# Patient Record
Sex: Male | Born: 1970 | Hispanic: Yes | Marital: Married | Smoking: Never smoker
Health system: Southern US, Community
[De-identification: ages and names within clinical notes are randomized; demographics above are authoritative.]

## PROBLEM LIST (undated history)

## (undated) HISTORY — PX: OTHER SURGICAL HISTORY: SHX169

---

## 2014-05-19 ENCOUNTER — Emergency Department: Payer: Self-pay | Admitting: Emergency Medicine

## 2018-04-14 ENCOUNTER — Emergency Department: Payer: Self-pay

## 2018-04-14 ENCOUNTER — Inpatient Hospital Stay
Admission: EM | Admit: 2018-04-14 | Discharge: 2018-04-16 | DRG: 872 | Disposition: A | Discharge status: Home / Self Care | Payer: Self-pay | Attending: Internal Medicine | Admitting: Internal Medicine

## 2018-04-14 ENCOUNTER — Encounter: Payer: Self-pay | Admitting: Internal Medicine

## 2018-04-14 ENCOUNTER — Other Ambulatory Visit: Payer: Self-pay

## 2018-04-14 DIAGNOSIS — K122 Cellulitis and abscess of mouth: Secondary | ICD-10-CM | POA: Diagnosis present

## 2018-04-14 DIAGNOSIS — J0430 Supraglottitis, unspecified, without obstruction: Secondary | ICD-10-CM | POA: Diagnosis present

## 2018-04-14 DIAGNOSIS — J9811 Atelectasis: Secondary | ICD-10-CM

## 2018-04-14 DIAGNOSIS — A419 Sepsis, unspecified organism: Principal | ICD-10-CM | POA: Diagnosis present

## 2018-04-14 DIAGNOSIS — K111 Hypertrophy of salivary gland: Secondary | ICD-10-CM | POA: Diagnosis present

## 2018-04-14 DIAGNOSIS — J9601 Acute respiratory failure with hypoxia: Secondary | ICD-10-CM

## 2018-04-14 DIAGNOSIS — E876 Hypokalemia: Secondary | ICD-10-CM | POA: Diagnosis present

## 2018-04-14 DIAGNOSIS — J020 Streptococcal pharyngitis: Secondary | ICD-10-CM | POA: Diagnosis present

## 2018-04-14 DIAGNOSIS — J392 Other diseases of pharynx: Secondary | ICD-10-CM | POA: Diagnosis present

## 2018-04-14 LAB — CBC WITH DIFFERENTIAL/PLATELET
Abs Immature Granulocytes: 0.2 10*3/uL — ABNORMAL HIGH (ref 0.00–0.07)
Basophils Absolute: 0.1 10*3/uL (ref 0.0–0.1)
Basophils Relative: 0 %
EOS ABS: 0.1 10*3/uL (ref 0.0–0.5)
Eosinophils Relative: 0 %
HEMATOCRIT: 41 % (ref 39.0–52.0)
HEMOGLOBIN: 14.1 g/dL (ref 13.0–17.0)
Immature Granulocytes: 1 %
LYMPHS ABS: 0.7 10*3/uL (ref 0.7–4.0)
Lymphocytes Relative: 3 %
MCH: 29.1 pg (ref 26.0–34.0)
MCHC: 34.4 g/dL (ref 30.0–36.0)
MCV: 84.7 fL (ref 80.0–100.0)
MONOS PCT: 5 %
Monocytes Absolute: 1.1 10*3/uL — ABNORMAL HIGH (ref 0.1–1.0)
Neutro Abs: 18.7 10*3/uL — ABNORMAL HIGH (ref 1.7–7.7)
Neutrophils Relative %: 91 %
Platelets: 204 10*3/uL (ref 150–400)
RBC: 4.84 MIL/uL (ref 4.22–5.81)
RDW: 12.3 % (ref 11.5–15.5)
WBC: 20.8 10*3/uL — ABNORMAL HIGH (ref 4.0–10.5)
nRBC: 0 % (ref 0.0–0.2)

## 2018-04-14 LAB — GROUP A STREP BY PCR: Group A Strep by PCR: DETECTED — AB

## 2018-04-14 LAB — COMPREHENSIVE METABOLIC PANEL
ALK PHOS: 60 U/L (ref 38–126)
ALT: 14 U/L (ref 0–44)
AST: 16 U/L (ref 15–41)
Albumin: 3.7 g/dL (ref 3.5–5.0)
Anion gap: 6 (ref 5–15)
BILIRUBIN TOTAL: 1 mg/dL (ref 0.3–1.2)
BUN: 12 mg/dL (ref 6–20)
CO2: 29 mmol/L (ref 22–32)
CREATININE: 1.03 mg/dL (ref 0.61–1.24)
Calcium: 9 mg/dL (ref 8.9–10.3)
Chloride: 99 mmol/L (ref 98–111)
GFR calc Af Amer: >60 mL/min (ref >60)
Glucose, Bld: 151 mg/dL — ABNORMAL HIGH (ref 70–99)
Potassium: 3.3 mmol/L — ABNORMAL LOW (ref 3.5–5.1)
Sodium: 134 mmol/L — ABNORMAL LOW (ref 135–145)
TOTAL PROTEIN: 7.4 g/dL (ref 6.5–8.1)

## 2018-04-14 LAB — HEMOGLOBIN A1C
Hgb A1c MFr Bld: 5.1 % (ref 4.8–5.6)
Mean Plasma Glucose: 99.67 mg/dL

## 2018-04-14 LAB — LACTIC ACID, PLASMA: Lactic Acid, Venous: 1.3 mmol/L (ref 0.5–1.9)

## 2018-04-14 LAB — PROTIME-INR
INR: 1.33
Prothrombin Time: 16.3 seconds — ABNORMAL HIGH (ref 11.4–15.2)

## 2018-04-14 LAB — TSH: TSH: 0.471 u[IU]/mL (ref 0.350–4.500)

## 2018-04-14 LAB — INFLUENZA PANEL BY PCR (TYPE A & B)
INFLBPCR: NEGATIVE
Influenza A By PCR: NEGATIVE

## 2018-04-14 LAB — LIPASE, BLOOD: Lipase: 24 U/L (ref 11–51)

## 2018-04-14 LAB — MRSA PCR SCREENING: MRSA BY PCR: NEGATIVE

## 2018-04-14 LAB — RAPID HIV SCREEN (HIV 1/2 AB+AG)
HIV 1/2 ANTIBODIES: NONREACTIVE
HIV-1 P24 Antigen - HIV24: NONREACTIVE

## 2018-04-14 LAB — PROCALCITONIN: Procalcitonin: 0.67 ng/mL

## 2018-04-14 LAB — TROPONIN I: Troponin I: <0.03 ng/mL (ref <0.03)

## 2018-04-14 MED ORDER — ACETAMINOPHEN 650 MG RE SUPP: 650.0000 mg | Freq: Four times a day (QID) | RECTAL | Status: DC | PRN

## 2018-04-14 MED ORDER — ONDANSETRON HCL 4 MG/2ML IJ SOLN
4.0000 mg | Freq: Once | INTRAMUSCULAR | Status: AC
  Administered 2018-04-14: 4 mg via INTRAVENOUS
  Filled 2018-04-14: qty 2

## 2018-04-14 MED ORDER — OXYMETAZOLINE HCL 0.05 % NA SOLN
1.0000 | Freq: Once | NASAL | Status: AC
  Administered 2018-04-14: 1 via NASAL
  Filled 2018-04-14: qty 15

## 2018-04-14 MED ORDER — SODIUM CHLORIDE 0.9 % IV BOLUS
1000.0000 mL | Freq: Once | INTRAVENOUS | Status: AC
  Administered 2018-04-14: 1000 mL via INTRAVENOUS

## 2018-04-14 MED ORDER — ACETAMINOPHEN 325 MG PO TABS
650.0000 mg | ORAL_TABLET | Freq: Four times a day (QID) | ORAL | Status: DC | PRN
  Administered 2018-04-14: 650 mg via ORAL
  Filled 2018-04-14: qty 2

## 2018-04-14 MED ORDER — LIDOCAINE HCL URETHRAL/MUCOSAL 2 % EX GEL
1.0000 "application " | Freq: Once | CUTANEOUS | Status: AC
  Administered 2018-04-14: 1 via TOPICAL
  Filled 2018-04-14: qty 10

## 2018-04-14 MED ORDER — IOHEXOL 300 MG/ML  SOLN
75.0000 mL | Freq: Once | INTRAMUSCULAR | Status: AC | PRN
  Administered 2018-04-14: 75 mL via INTRAVENOUS

## 2018-04-14 MED ORDER — ONDANSETRON HCL 4 MG PO TABS: 4.0000 mg | ORAL_TABLET | Freq: Four times a day (QID) | ORAL | Status: DC | PRN

## 2018-04-14 MED ORDER — ACETAMINOPHEN 10 MG/ML IV SOLN
1000.0000 mg | Freq: Once | INTRAVENOUS | Status: AC
  Administered 2018-04-14: 1000 mg via INTRAVENOUS
  Filled 2018-04-14: qty 100

## 2018-04-14 MED ORDER — ONDANSETRON HCL 4 MG/2ML IJ SOLN: 4.0000 mg | Freq: Four times a day (QID) | INTRAMUSCULAR | Status: DC | PRN

## 2018-04-14 MED ORDER — SODIUM CHLORIDE 0.9 % IV SOLN
3.0000 g | Freq: Four times a day (QID) | INTRAVENOUS | Status: DC
  Administered 2018-04-14 – 2018-04-16 (×9): 3 g via INTRAVENOUS
  Filled 2018-04-14 (×13): qty 3

## 2018-04-14 MED ORDER — ENOXAPARIN SODIUM 40 MG/0.4ML ~~LOC~~ SOLN
40.0000 mg | SUBCUTANEOUS | Status: DC
  Administered 2018-04-14 – 2018-04-15 (×2): 40 mg via SUBCUTANEOUS
  Filled 2018-04-14 (×2): qty 0.4

## 2018-04-14 MED ORDER — POTASSIUM CHLORIDE IN NACL 40-0.9 MEQ/L-% IV SOLN
INTRAVENOUS | Status: DC
  Administered 2018-04-14 – 2018-04-15 (×3): 125 mL/h via INTRAVENOUS
  Administered 2018-04-15: 50 mL/h via INTRAVENOUS
  Filled 2018-04-14 (×8): qty 1000

## 2018-04-14 MED ORDER — DOCUSATE SODIUM 100 MG PO CAPS
100.0000 mg | ORAL_CAPSULE | Freq: Two times a day (BID) | ORAL | Status: DC
  Administered 2018-04-14 – 2018-04-16 (×4): 100 mg via ORAL
  Filled 2018-04-14 (×4): qty 1

## 2018-04-14 MED ORDER — MORPHINE SULFATE (PF) 4 MG/ML IV SOLN
4.0000 mg | Freq: Once | INTRAVENOUS | Status: AC
  Administered 2018-04-14: 4 mg via INTRAVENOUS
  Filled 2018-04-14: qty 1

## 2018-04-14 MED ORDER — DEXAMETHASONE SODIUM PHOSPHATE 4 MG/ML IJ SOLN
4.0000 mg | Freq: Four times a day (QID) | INTRAMUSCULAR | Status: DC
  Administered 2018-04-14 – 2018-04-16 (×8): 4 mg via INTRAVENOUS
  Filled 2018-04-14 (×10): qty 1

## 2018-04-14 MED ORDER — DEXAMETHASONE SODIUM PHOSPHATE 10 MG/ML IJ SOLN
10.0000 mg | Freq: Once | INTRAMUSCULAR | Status: AC
  Administered 2018-04-14: 10 mg via INTRAVENOUS
  Filled 2018-04-14: qty 1

## 2018-04-14 NOTE — ED Notes (Signed)
Pt continues to clear throat and is now c/o pain in throat with swallowing and without swallowing

## 2018-04-14 NOTE — Progress Notes (Signed)
Admitted this morning for pharyngeal edema, status post laryngoscopy by ENT, patent airway improved left pyriform sinus edema, pleural adenoid edema.  Receiving IV steroids.  Watch in ICU for impending respiratory status.  Continue Unasyn, Decadron, IV fluids, okay to start a diet from ENT standpoint.  Possible repeat endoscopy tomorrow.  Labs, medicines reviewed.  Patient positive for group B strep.

## 2018-04-14 NOTE — Consult Note (Signed)
Jeffrey Gillespie, Jeffrey Gillespie [id number] 1971-05-28 Jeffrey Brittle, MD  Reason for Consult: Neck swelling  HPI: 47 y.o. hispanic male presents to ED with 2 day history of throat swelling and pain and fever.  Reports began yesterday and has progressed with odynophagia and difficulty opening mouth.  Reports several day history of left sided mandibular tooth pain prior to presentation of throat pain and fever.  Denies any breathing difficulty currently.  Allergies: No Known Allergies  ROS: Review of systems normal other than 12 systems except per HPI.  PMH: No past medical history on file.  FH: No family history on file.  SH:  Social History   Socioeconomic History  . Marital status: Married    Spouse name: Not on file  . Number of children: Not on file  . Years of education: Not on file  . Highest education level: Not on file  Occupational History  . Not on file  Social Needs  . Financial resource strain: Not on file  . Food insecurity:    Worry: Not on file    Inability: Not on file  . Transportation needs:    Medical: Not on file    Non-medical: Not on file  Tobacco Use  . Smoking status: Not on file  Substance and Sexual Activity  . Alcohol use: Not on file  . Drug use: Not on file  . Sexual activity: Not on file  Lifestyle  . Physical activity:    Days per week: Not on file    Minutes per session: Not on file  . Stress: Not on file  Relationships  . Social connections:    Talks on phone: Not on file    Gets together: Not on file    Attends religious service: Not on file    Active member of club or organization: Not on file    Attends meetings of clubs or organizations: Not on file    Relationship status: Not on file  . Intimate partner violence:    Fear of current or ex partner: Not on file    Emotionally abused: Not on file    Physically abused: Not on file    Forced sexual activity: Not on file  Other Topics Concern  . Not on file  Social History Narrative  .  Not on file    PSH: denies immunization for mumps  Physical  Exam:  GEN-  CN 2-12 grossly intact and symmetric. EARS-  External ears clear NOSE-  Clear anteriorly OC/OP-  Trismus to 2cm, poor dentition, tenderness and mild erythema surrounding fractured molar on left.  Soft floor of mouth with no induration NECK-  Significant bilateral tender level 1 and level 2 edema and submandibular glands, no fluctuance felt Resp- unlabored CARD-  RRR  Procedure:  Flexible laryngoscopy-  After verbal consent obtained, the patient's nasal cavity was anesthetized with Afrin and Lidocaine.  Trans-nasal flexible laryngoscopy performed.  This demonstrated bilateral tonsillar edema and inflammation L >R, and lateral pharyngeal wall swelling on left and involvement of left piriform sinus and left arytenoid with boggy edema.  The arytenoid is prolapsing into the patient's airway.  Vocal folds easily visualized and patent.   CT-  Fat stranding submental and floor of mouth and bilateral submandibular gland swelling, left supraglottic and parapharyngeal swelling on left  A/P: Pharyngitis/Supraglottitis/Cellulitis  Plan:  Unclear where source of infection is from, but favor dental despite no findings on CT scan.  Given findings recommend admission and will plan on repeat Laryngoscopy around  noon to see how airway is progressing.  Agree with mumps workup given CT findings but suspect dental source with spread.  Recommend IV Abx as well as IV Decadron.  Will recheck around noon.   Jeffrey Gillespie 04/14/2018 6:56 AM

## 2018-04-14 NOTE — Consult Note (Signed)
   Name: Jeffrey Gillespie MRN: [id number] DOB: 08/04/1970     CONSULTATION DATE: 04/14/2018   HISTORY OF PRESENT ILLNESS:    47 years old man with no past medical history.  Patient presented to the ED with lower jaw and neck pain.  Pain was associated with fever and upper neck swelling.  While in the emergency room the patient was evaluated by ENT and had endoscopy done and was diagnosed with posterior pharyngeal swelling.  He was started on antibiotics plus Decadron and admission to critical care was requested for airway monitoring. Patient arrived to ICU awake in no distress and tolerating room air.  All history was obtained the form the EMR and the patient. PAST MEDICAL HISTORY :   has no past medical history on file.  has a past surgical history that includes none. Prior to Admission medications   Not on File   No Known Allergies  FAMILY HISTORY:  family history is not on file. SOCIAL HISTORY:  reports that he has never smoked. He has never used smokeless tobacco.  REVIEW OF SYSTEMS:   Unable to obtain due to critical illness   VITAL SIGNS: Temp:  [98.2 F (36.8 C)-99.9 F (37.7 C)] 98.2 F (36.8 C) (11/02 1500) Pulse Rate:  [76-127] 83 (11/02 1500) Resp:  [17-25] 25 (11/02 1500) BP: (103-132)/(67-90) 111/73 (11/02 1500) SpO2:  [91 %-97 %] 96 % (11/02 1500) Weight:  [83.3 kg-86.2 kg] 83.3 kg (11/02 1030)  Physical Examination:  Awake and oriented with no acute focal neurological deficits Tolerating room air, no distress, able to talk in full sentences, bilateral equal air entry with no adventitious sounds S1 & S2 are audible with no murmur Benign abdomen with normal peristalsis No leg edema  ASSESSMENT / PLAN:  Impending acute respiratory failure was airway compromise due to pharyngeal edema.  Currently patient is tolerating room air no distress and able to talk in full sentences. -Monitor airway and consider intubation for airway protection if no  improvement  Subglottic edema with dysphagia was acute bilateral submandibular sialadenitis left more than right, status post transnasal flexible laryngoscopy.  Improving as reported by ENT.  Group A strep culture -Unasyn + Decadron -Management as per ENT  Hypokalemia -Replete and monitor electrolytes  Supportive care  Critical care time 35 minutes

## 2018-04-14 NOTE — Op Note (Signed)
....  04/14/2018  11:51 AM    [name]  [id number]   Pre-Op Dx:  Supraglottic edema, dysphagia  Post-op Dx: same  Proc: Trans-nasal flexible laryngoscopy  Surg:  Kerry Dory:  Topical 4% Lidocaine with Phenylephrine  EBL:  none  Comp:  none  Findings:  Improved left arytenoid edema with resolution of bogginess and prolapse into airway, continued but improved left piriform sinus edema and erythema.  Widely patent airway.  Procedure: After the patient was identified in ICU bed and verbal consent obtained, the patient was anesthetized topically with Lidocaine/Phenylephrine mixture.  At this time, the flexible fiberoptic scope was placed within the patient's right nasal cavity.  Visualization of the patient's nasal cavity, nasopharynx, pharynx, and larynx and hypopharynx was made.  Findings are listed above.  The patient tolerated the procedure well.  Dispo:   To ICU stable condition  Plan:  OK for diet from ENT perspective.  Will consider repeat laryngoscopy tomorrow.  Edmar Blankenburg  04/14/2018 11:51 AM

## 2018-04-14 NOTE — ED Notes (Signed)
Pt placed on droplet precautions until cleared from dx of mumps after discussion with charge RN Gearldine Bienenstock

## 2018-04-14 NOTE — ED Provider Notes (Signed)
[facility] Emergency Department Provider Note  ____________________________________________   First MD Initiated Contact with Patient 04/14/18 [phone]     (approximate)  I have reviewed the triage vital signs and the nursing notes.   HISTORY  Chief Complaint Dental Pain    HPI Jeffrey Gillespie is a 47 y.o. male who comes to the emergency department with roughly 1 month of poor dentition and dental pain although his symptoms have been acutely worse for the past 2 days.  This evening his symptoms became more severe and it was difficult for him to swallow.  He does feel like he has had a fever and chills although has not measured it.  No cough.  He has not seen a dentist in "years".  He denies past medical history and takes no medications.  Nothing seems to make his symptoms better or worse.  He was able to eat and drink without difficulty this morning.  The pain is primarily posteriorly on his left upper and lower teeth.  Symptoms are currently moderate in severity.  His pain is in his left lower and left upper tooth sharp aching and nonradiating.     No past medical history on file.  There are no active problems to display for this patient.     Prior to Admission medications   Not on File    Allergies Patient has no known allergies.  No family history on file.  Social History Social History   Tobacco Use  . Smoking status: Not on file  Substance Use Topics  . Alcohol use: Not on file  . Drug use: Not on file    Review of Systems Constitutional: Positive for fevers Eyes: No visual changes. ENT: Positive for dental pain Cardiovascular: Denies chest pain. Respiratory: Denies shortness of breath. Gastrointestinal: No abdominal pain.  No nausea, no vomiting.  No diarrhea.  No constipation. Genitourinary: Negative for dysuria. Musculoskeletal: Negative for back pain. Skin: Negative for rash. Neurological: Negative for headaches, focal weakness  or numbness.   ____________________________________________   PHYSICAL EXAM:  VITAL SIGNS: ED Triage Vitals [04/14/18 0351]  Enc Vitals Group     BP      Pulse      Resp      Temp      Temp src      SpO2      Weight 190 lb (86.2 kg)     Height 5\' 11"  (1.803 m)     Head Circumference      Peak Flow      Pain Score 9     Pain Loc      Pain Edu?      Excl. in GC?     Constitutional: Alert and oriented x4 speaks in full clear sentences with no change in his voice Eyes: PERRL EOMI. Head: Atraumatic. Nose: No congestion/rhinnorhea. Mouth/Throat: Positive for trismus.  He has no sublingual induration although he does have submandibular induration and fullness.  Uvula is midline.  He has left posterior pharyngeal erythema but no exudate Neck: No stridor.   Cardiovascular: Tachycardic rate, regular rhythm. Grossly normal heart sounds.  Good peripheral circulation. Respiratory: Normal respiratory effort.  No retractions. Lungs CTAB and moving good air Gastrointestinal: Soft nontender Musculoskeletal: No lower extremity edema   Neurologic:  Normal speech and language. No gross focal neurologic deficits are appreciated. Skin:  Skin is warm, dry and intact. No rash noted. Psychiatric: Mood and affect are normal. Speech and behavior are normal.  ____________________________________________   DIFFERENTIAL includes but not limited to  Ludwig's angina, mumps, dental infection, dental abscess ____________________________________________   LABS (all labs ordered are listed, but only abnormal results are displayed)  Labs Reviewed  GROUP A STREP BY PCR - Abnormal; Notable for the following components:      Result Value   Group A Strep by PCR DETECTED (*)    All other components within normal limits  COMPREHENSIVE METABOLIC PANEL - Abnormal; Notable for the following components:   Sodium 134 (*)    Potassium 3.3 (*)    Glucose, Bld 151 (*)    All other components within  normal limits  CBC WITH DIFFERENTIAL/PLATELET - Abnormal; Notable for the following components:   WBC 20.8 (*)    Neutro Abs 18.7 (*)    Monocytes Absolute 1.1 (*)    Abs Immature Granulocytes 0.20 (*)    All other components within normal limits  PROTIME-INR - Abnormal; Notable for the following components:   Prothrombin Time 16.3 (*)    All other components within normal limits  CULTURE, BLOOD (ROUTINE X 2)  CULTURE, BLOOD (ROUTINE X 2)  RESPIRATORY PANEL BY PCR  LIPASE, BLOOD  TROPONIN I  PROCALCITONIN  LACTIC ACID, PLASMA  INFLUENZA PANEL BY PCR (TYPE A & B)  RAPID HIV SCREEN (HIV 1/2 AB+AG)  URINALYSIS, ROUTINE W REFLEX MICROSCOPIC  MUMPS ANTIBODY, IGG  MUMPS ANTIBODY, IGM  MISC [facility] TEST (SEND OUT)    Lab work reviewed by me shows white count of 21 concerning for acute infection.  He is group A strep positive as well.  Increased sugar could be secondary to stress or undiagnosed diabetes mellitus. __________________________________________  EKG  ED ECG REPORT I, [name], the attending physician, personally viewed and interpreted this ECG.  Date: 04/14/2018 EKG Time:  Rate: 110 Rhythm: Sinus tachycardia QRS Axis: normal Intervals: normal ST/T Wave abnormalities: normal Narrative Interpretation: no evidence of acute ischemia  ____________________________________________  RADIOLOGY  CT of the neck reviewed by me shows submandibular swelling with a patent airway.  No abscess identified Chest x-ray reviewed by me with no acute disease ____________________________________________   PROCEDURES  Procedure(s) performed: Yes  .Critical Care Performed by: [name], MD Authorized by: [name], MD   Critical care provider statement:    Critical care time (minutes):  35   Critical care time was exclusive of:  Separately billable procedures and treating other patients   Critical care was necessary to treat or prevent imminent or  life-threatening deterioration of the following conditions:  Respiratory failure   Critical care was time spent personally by me on the following activities:  Development of treatment plan with patient or surrogate, discussions with consultants, evaluation of patient's response to treatment, examination of patient, obtaining history from patient or surrogate, ordering and performing treatments and interventions, ordering and review of laboratory studies, ordering and review of radiographic studies, pulse oximetry, re-evaluation of patient's condition and review of old charts    Critical Care performed: Yes  ____________________________________________   INITIAL IMPRESSION / ASSESSMENT AND PLAN / ED COURSE  Pertinent labs & imaging results that were available during my care of the patient were reviewed by me and considered in my medical decision making (see chart for details).   As part of my medical decision making, I reviewed the following data within the electronic MEDICAL RECORD NUMBER History obtained from family if available, nursing notes, old chart and ekg, as well as notes from prior ED visits.  The  patient comes to the emergency department with a temperature of 99.9 degrees orally and likely has a true fever should recheck her core temperature.  He has trismus concerning for posterior infection and is tachycardic.  He has no history of diabetes but does not see doctors frequently.  I have a high clinical suspicion for Ludwig's angina so cultures and broad labs are ordered in addition to IV Unasyn every 6 hours.  CT soft tissue the neck is pending at this point.  He is protecting his airway.     ----------------------------------------- 6:56 AM on 04/14/2018 -----------------------------------------  Dr. Andee Poles came to bedside and scoped the patient and agrees with inpatient admission for IV unasyn, recommends IV decadron and he'll rescope the patient around noon to evaluate for his airway  patency.  I spoke with Dr. Sheryle Hail of the hospitalist service who has graciously agreed to admit the patient to his service.  The patient's been updated and agrees with the plan.  His pain is controlled after 4 mg of IV morphine and 4 mg of IV Zofran. ____________________________________________   FINAL CLINICAL IMPRESSION(S) / ED DIAGNOSES  Final diagnoses:  Ludwig's angina  Sepsis, due to unspecified organism, unspecified whether acute organ dysfunction present [facility])      NEW MEDICATIONS STARTED DURING THIS VISIT:  New Prescriptions   No medications on file     Note:  This document was prepared using Dragon voice recognition software and may include unintentional dictation errors.     [name], MD 04/14/18 [phone]

## 2018-04-14 NOTE — Progress Notes (Signed)
CODE SEPSIS - PHARMACY COMMUNICATION  **Broad Spectrum Antibiotics should be administered within 1 hour of Sepsis diagnosis**  Time Code Sepsis Called/Page Received: 11/02 0415  Antibiotics Ordered: 1102 0416  Time of 1st antibiotic administration: 1102 0437  Additional action taken by pharmacy:   If necessary, Name of Provider/Nurse Contacted:    Erich Montane ,PharmD Clinical Pharmacist  04/14/2018  4:51 AM

## 2018-04-14 NOTE — ED Notes (Signed)
Pt given small amount of water - ok's by Dr Shaune Pollack

## 2018-04-14 NOTE — ED Notes (Signed)
Called to give report on pt and was advised that the room had a pt in it and they would return the call when the bed was ready

## 2018-04-14 NOTE — ED Triage Notes (Signed)
Pt ambulatory to triage with no difficulty. Pt reports started 2 days ago with dental pain to teeth on the left side upper and lower. Tonight developed pain in his throat ans swelling. Pt talking in full and complete sentences with no difficulty at this time.

## 2018-04-14 NOTE — H&P (Signed)
Jeffrey Gillespie is an 47 y.o. male.   Chief Complaint: Dental pain HPI: The patient with no significant past medical history presents emergency department complaining of jaw and neck pain.  The patient reports that his symptoms began with dental pain in the lower left molars approximately 1 month ago.  The pain gradually migrated up to his jaw and then into his upper posterior jawline on the left.  He admits to some intermittent headache in this last month but reports that he began to have fever this morning and swelling of his neck and jaw yesterday.  In the emergency department today it is difficult for the patient to open his mouth.  Endoscopy was performed in the emergency department and otolaryngology felt that there may be some posterior pharyngeal swelling.  They recommended antibiotics as well as Decadron which was initiated prior to the emergency department staff calling the hospitalist service for admission  History reviewed. No pertinent past medical history.  No chronic medical problems  Past Surgical History:  Procedure Laterality Date  . none      Family History  Problem Relation Age of Onset  . Diabetes Mellitus II Neg Hx   . CAD Neg Hx    Social History:  has no tobacco, alcohol, and drug history on file.  Allergies: No Known Allergies  Prior to Admission medications   Not on File     Results for orders placed or performed during the hospital encounter of 04/14/18 (from the past 48 hour(s))  Comprehensive metabolic panel     Status: Abnormal   Collection Time: 04/14/18  4:24 AM  Result Value Ref Range   Sodium 134 (L) 135 - 145 mmol/L   Potassium 3.3 (L) 3.5 - 5.1 mmol/L   Chloride 99 98 - 111 mmol/L   CO2 29 22 - 32 mmol/L   Glucose, Bld 151 (H) 70 - 99 mg/dL   BUN 12 6 - 20 mg/dL   Creatinine, Ser 1.03 0.61 - 1.24 mg/dL   Calcium 9.0 8.9 - 10.3 mg/dL   Total Protein 7.4 6.5 - 8.1 g/dL   Albumin 3.7 3.5 - 5.0 g/dL   AST 16 15 - 41 U/L   ALT 14 0 - 44 U/L   Alkaline Phosphatase 60 38 - 126 U/L   Total Bilirubin 1.0 0.3 - 1.2 mg/dL   GFR calc non Af Amer >60 >60 mL/min   GFR calc Af Amer >60 >60 mL/min    Comment: (NOTE) The eGFR has been calculated using the CKD EPI equation. This calculation has not been validated in all clinical situations. eGFR's persistently <60 mL/min signify possible Chronic Kidney Disease.    Anion gap 6 5 - 15    Comment: Performed at [facility], [address] [place], [place] [address]  Lipase, blood     Status: None   Collection Time: 04/14/18  4:24 AM  Result Value Ref Range   Lipase 24 11 - 51 U/L    Comment: Performed at [facility], [address] [place], [place] [address]  Troponin I     Status: None   Collection Time: 04/14/18  4:24 AM  Result Value Ref Range   Troponin I <0.03 <0.03 ng/mL    Comment: Performed at [facility], [address] [place], [place] [address]  CBC WITH DIFFERENTIAL     Status: Abnormal   Collection Time: 04/14/18  4:24 AM  Result Value Ref Range   WBC 20.8 (H) 4.0 -  10.5 K/uL   RBC 4.84 4.22 - 5.81 MIL/uL   Hemoglobin 14.1 13.0 - 17.0 g/dL   HCT 41.0 39.0 - 52.0 %   MCV 84.7 80.0 - 100.0 fL   MCH 29.1 26.0 - 34.0 pg   MCHC 34.4 30.0 - 36.0 g/dL   RDW 12.3 11.5 - 15.5 %   Platelets 204 150 - 400 K/uL   nRBC 0.0 0.0 - 0.2 %   Neutrophils Relative % 91 %   Neutro Abs 18.7 (H) 1.7 - 7.7 K/uL   Lymphocytes Relative 3 %   Lymphs Abs 0.7 0.7 - 4.0 K/uL   Monocytes Relative 5 %   Monocytes Absolute 1.1 (H) 0.1 - 1.0 K/uL   Eosinophils Relative 0 %   Eosinophils Absolute 0.1 0.0 - 0.5 K/uL   Basophils Relative 0 %   Basophils Absolute 0.1 0.0 - 0.1 K/uL   RBC Morphology MORPHOLOGY UNREMARKABLE    Immature Granulocytes 1 %   Abs Immature Granulocytes 0.20 (H) 0.00 - 0.07 K/uL    Comment: Performed at [facility], [address] [place], [place] [address]  Procalcitonin     Status: None   Collection Time: 04/14/18  4:24 AM   Result Value Ref Range   Procalcitonin 0.67 ng/mL    Comment:        Interpretation: PCT > 0.5 ng/mL and <= 2 ng/mL: Systemic infection (sepsis) is possible, but other conditions are known to elevate PCT as well. (NOTE)       Sepsis PCT Algorithm           Lower Respiratory Tract                                      Infection PCT Algorithm    ----------------------------     ----------------------------         PCT < 0.25 ng/mL                PCT < 0.10 ng/mL         Strongly encourage             Strongly discourage   discontinuation of antibiotics    initiation of antibiotics    ----------------------------     -----------------------------       PCT 0.25 - 0.50 ng/mL            PCT 0.10 - 0.25 ng/mL               OR       >80% decrease in PCT            Discourage initiation of                                            antibiotics      Encourage discontinuation           of antibiotics    ----------------------------     -----------------------------         PCT >= 0.50 ng/mL              PCT 0.26 - 0.50 ng/mL                AND       <80% decrease in PCT  Encourage initiation of                                             antibiotics       Encourage continuation           of antibiotics    ----------------------------     -----------------------------        PCT >= 0.50 ng/mL                  PCT > 0.50 ng/mL               AND         increase in PCT                  Strongly encourage                                      initiation of antibiotics    Strongly encourage escalation           of antibiotics                                     -----------------------------                                           PCT <= 0.25 ng/mL                                                 OR                                        > 80% decrease in PCT                                     Discontinue / Do not initiate                                              antibiotics Performed at [facility], [address] [place], [place] [address]   Protime-INR     Status: Abnormal   Collection Time: 04/14/18  4:24 AM  Result Value Ref Range   Prothrombin Time 16.3 (H) 11.4 - 15.2 seconds   INR 1.33     Comment: Performed at [facility], [address], [place], [place] [address]  Blood Culture (routine x 2)     Status: None (Preliminary result)   Collection Time: 04/14/18  4:24 AM  Result Value Ref Range   Specimen Description BLOOD RIGHT FOREARM    Special Requests      BOTTLES DRAWN AEROBIC AND ANAEROBIC Blood Culture adequate volume   Culture  NO GROWTH < 12 HOURS Performed at [facility], [address] [place], [place] [address]    Report Status PENDING   Blood Culture (routine x 2)     Status: None (Preliminary result)   Collection Time: 04/14/18  4:24 AM  Result Value Ref Range   Specimen Description BLOOD RIGHT ANTECUBITAL    Special Requests      BOTTLES DRAWN AEROBIC AND ANAEROBIC Blood Culture adequate volume   Culture      NO GROWTH < 12 HOURS Performed at [facility], [address] [place], [place] [address]    Report Status PENDING   Lactic acid, plasma     Status: None   Collection Time: 04/14/18  4:24 AM  Result Value Ref Range   Lactic Acid, Venous 1.3 0.5 - 1.9 mmol/L    Comment: Performed at [facility], [place], [place] [address]  Rapid HIV screen (HIV 1/2 Ab+Ag)     Status: None   Collection Time: 04/14/18  4:24 AM  Result Value Ref Range   HIV-1 P24 Antigen - HIV24 NON REACTIVE NON REACTIVE   HIV 1/2 Antibodies NON REACTIVE NON REACTIVE   Interpretation (HIV Ag Ab)      A non reactive test result means that HIV 1 or HIV 2 antibodies and HIV 1 p24 antigen were not detected in the specimen.    Comment: Performed at [facility], [address] [place], [place] [address]  Influenza panel by PCR (type A & B)     Status: None    Collection Time: 04/14/18  6:16 AM  Result Value Ref Range   Influenza A By PCR NEGATIVE NEGATIVE   Influenza B By PCR NEGATIVE NEGATIVE    Comment: (NOTE) The Xpert Xpress Flu assay is intended as an aid in the diagnosis of  influenza and should not be used as a sole basis for treatment.  This  assay is FDA approved for nasopharyngeal swab specimens only. Nasal  washings and aspirates are unacceptable for Xpert Xpress Flu testing. Performed at [facility], [place], [place] [address]   Group A Strep by PCR [facility] Only)     Status: Abnormal   Collection Time: 04/14/18  6:16 AM  Result Value Ref Range   Group A Strep by PCR DETECTED (A) NOT DETECTED    Comment: Performed at [facility], [address] [place], [place] [address]   Ct Soft Tissue Neck W Contrast  Result Date: 04/14/2018 CLINICAL DATA:  LEFT dental pain for 2 days, now with throat pain and swelling. EXAM: CT NECK WITH CONTRAST TECHNIQUE: Multidetector CT imaging of the neck was performed using the standard protocol following the bolus administration of intravenous contrast. CONTRAST:  60m OMNIPAQUE IOHEXOL 300 MG/ML  SOLN COMPARISON:  None. FINDINGS: PHARYNX AND LARYNX: Mild LEFT hypopharyngeal edema narrowing the LEFT piriform sinus. No extension into floor mouth. SALIVARY GLANDS: Mildly enlarged edematous LEFT greater than RIGHT submandibular glands without sialolith, ductal dilatation or mass. Normal parotid glands. THYROID: Normal. LYMPH NODES: Mild reactive lymphadenopathy with reniform morphology, homogeneous enhancement. VASCULAR: Normal. LIMITED INTRACRANIAL: Normal. VISUALIZED ORBITS: Normal. MASTOIDS AND VISUALIZED PARANASAL SINUSES: Mildly atretic RIGHT maxillary sinus with findings of chronic sinusitis. LEFT maxillary mucosal retention cyst. Minimal LEFT mastoid effusion. SKELETON: Nonacute. No CT findings of dental pathology. Moderate C6-7 spondylosis. Moderate RIGHT  temporomandibular osteoarthrosis. UPPER CHEST: Lung apices are clear. No superior mediastinal lymphadenopathy. OTHER: Anterior neck subcutaneous fat  stranding. Punctate LEFT anterior neck calcification versus radiopaque foreign bodies. IMPRESSION: 1. Acute LEFT greater than RIGHT submandibular sialoadenitis. 2. Transspatial edema sparing floor of mouth. No abscess. Patent airway. 3. Mild reactive lymphadenopathy. 4. Punctate LEFT anterior neck radiopaque foreign body versus calcification, potentially related. Electronically Signed   By: Elon Alas M.D.   On: 04/14/2018 05:37   Dg Chest Port 1 View  Result Date: 04/14/2018 CLINICAL DATA:  47 year old male with sepsis. EXAM: PORTABLE CHEST 1 VIEW COMPARISON:  None. FINDINGS: The heart size and mediastinal contours are within normal limits. Both lungs are clear. The visualized skeletal structures are unremarkable. IMPRESSION: No active disease. Electronically Signed   By: Anner Crete M.D.   On: 04/14/2018 05:09    Review of Systems  Constitutional: Positive for fever. Negative for chills.  HENT: Positive for sore throat. Negative for tinnitus.   Eyes: Negative for blurred vision and redness.  Respiratory: Negative for cough and shortness of breath.   Cardiovascular: Negative for chest pain, palpitations, orthopnea and PND.  Gastrointestinal: Negative for abdominal pain, diarrhea, nausea and vomiting.  Genitourinary: Negative for dysuria, frequency and urgency.  Musculoskeletal: Negative for joint pain and myalgias.  Skin: Negative for rash.       No lesions  Neurological: Negative for speech change, focal weakness and weakness.  Endo/Heme/Allergies: Does not bruise/bleed easily.       No temperature intolerance  Psychiatric/Behavioral: Negative for depression and suicidal ideas.    Blood pressure 103/67, pulse (!) 114, temperature 99.9 F (37.7 C), temperature source Oral, resp. rate (!) 24, height 5' 11"  (1.803 m), weight 86.2 kg,  SpO2 91 %. Physical Exam  Constitutional: He is oriented to person, place, and time. He appears well-developed and well-nourished. No distress.  HENT:  Head: Normocephalic and atraumatic.  Mouth/Throat: Oropharynx is clear and moist.  Eyes: Pupils are equal, round, and reactive to light. Conjunctivae and EOM are normal. No scleral icterus.  Neck: Normal range of motion. Neck supple. No JVD present. No tracheal deviation present. No thyromegaly present.    trismus  Cardiovascular: Normal rate, regular rhythm and normal heart sounds. Exam reveals no gallop and no friction rub.  No murmur heard. Respiratory: Effort normal and breath sounds normal. No respiratory distress. He has no wheezes.  GI: Soft. Bowel sounds are normal. He exhibits no distension. There is no tenderness.  Genitourinary:  Genitourinary Comments: Deferred  Musculoskeletal: Normal range of motion. He exhibits no edema.  Lymphadenopathy:    He has no cervical adenopathy.  Neurological: He is alert and oriented to person, place, and time. No cranial nerve deficit.  Skin: Skin is warm and dry. No rash noted. No erythema.  Psychiatric: He has a normal mood and affect. His behavior is normal. Judgment and thought content normal.     Assessment/Plan This is a 47 year old male admitted for pharyngeal edema. 1.  Pharyngeal edema: Differential diagnosis includes mumps versus Ludwig's angina.  Significant submandibular swelling.  The patient reports that he is fully vaccinated.  Continue Unasyn as well as Decadron.  ENT to reevaluate in a few hours. 2.  Hypokalemia: Replete potassium 3.  DVT prophylaxis: Lovenox 4.  GI prophylaxis: None The patient is a full code.  Time spent on admission orders and patient care approximately 45 minutes  Harrie Foreman, MD 04/14/2018, 8:04 AM

## 2018-04-14 NOTE — ED Notes (Signed)
Pt has swelling to bilat side of neck - he denies shortness of breath or difficulty breathing at this time - he reports that he is having pain with swallowing and request water to rinse his mouth - at this time pt respirations are even and unlabored and NAD

## 2018-04-15 ENCOUNTER — Inpatient Hospital Stay: Payer: Self-pay

## 2018-04-15 LAB — URINALYSIS, ROUTINE W REFLEX MICROSCOPIC
Bilirubin Urine: NEGATIVE
Glucose, UA: NEGATIVE mg/dL
Hgb urine dipstick: NEGATIVE
Ketones, ur: 5 mg/dL — AB
Leukocytes, UA: NEGATIVE
Nitrite: NEGATIVE
Protein, ur: NEGATIVE mg/dL
Specific Gravity, Urine: 1.019 (ref 1.005–1.030)
pH: 7 (ref 5.0–8.0)

## 2018-04-15 LAB — RESPIRATORY PANEL BY PCR
Adenovirus: NOT DETECTED
BORDETELLA PERTUSSIS-RVPCR: NOT DETECTED
CORONAVIRUS HKU1-RVPPCR: NOT DETECTED
CORONAVIRUS NL63-RVPPCR: NOT DETECTED
Chlamydophila pneumoniae: NOT DETECTED
Coronavirus 229E: NOT DETECTED
Coronavirus OC43: NOT DETECTED
Influenza A: NOT DETECTED
Influenza B: NOT DETECTED
METAPNEUMOVIRUS-RVPPCR: NOT DETECTED
Mycoplasma pneumoniae: NOT DETECTED
PARAINFLUENZA VIRUS 2-RVPPCR: NOT DETECTED
PARAINFLUENZA VIRUS 3-RVPPCR: NOT DETECTED
Parainfluenza Virus 1: NOT DETECTED
Parainfluenza Virus 4: NOT DETECTED
RHINOVIRUS / ENTEROVIRUS - RVPPCR: NOT DETECTED
Respiratory Syncytial Virus: NOT DETECTED

## 2018-04-15 LAB — BASIC METABOLIC PANEL
Anion gap: 8 (ref 5–15)
BUN: 13 mg/dL (ref 6–20)
CO2: 22 mmol/L (ref 22–32)
Calcium: 8.8 mg/dL — ABNORMAL LOW (ref 8.9–10.3)
Chloride: 109 mmol/L (ref 98–111)
Creatinine, Ser: 0.73 mg/dL (ref 0.61–1.24)
GFR calc Af Amer: >60 mL/min (ref >60)
GLUCOSE: 140 mg/dL — AB (ref 70–99)
POTASSIUM: 4 mmol/L (ref 3.5–5.1)
Sodium: 139 mmol/L (ref 135–145)

## 2018-04-15 LAB — PHOSPHORUS: Phosphorus: 2.3 mg/dL — ABNORMAL LOW (ref 2.5–4.6)

## 2018-04-15 LAB — MAGNESIUM: Magnesium: 2 mg/dL (ref 1.7–2.4)

## 2018-04-15 MED ORDER — BOOST / RESOURCE BREEZE PO LIQD CUSTOM
1.0000 | Freq: Three times a day (TID) | ORAL | Status: DC
  Administered 2018-04-15 (×2): 1 via ORAL

## 2018-04-15 MED ORDER — ADULT MULTIVITAMIN W/MINERALS CH
1.0000 | ORAL_TABLET | Freq: Every day | ORAL | Status: DC
  Administered 2018-04-16: 1 via ORAL
  Filled 2018-04-15: qty 1

## 2018-04-15 NOTE — Progress Notes (Addendum)
[facility]   PATIENT NAME: Jeffrey Gillespie    MR#:  [id number]  DATE OF BIRTH:  [birth year]  SUBJECTIVE: Patient admitted for pharyngitis, angioedema, strep A pharyngitis.  Receiving IV antibiotics, IV Decadron, he states that his swelling decreased, his pain also decreased in pharyngeal area and able to open mouth fully.  On room air right now.  CHIEF COMPLAINT:   Chief Complaint  Patient presents with  . Dental Pain    REVIEW OF SYSTEMS:   ROS CONSTITUTIONAL: No fever, fatigue or weakness.  EYES: No blurred or double vision.  EARS, NOSE, AND THROAT: No tinnitus or ear pain.  Patient has s edema in submandibular area, slightly hard to palpation but no tenderness. RESPIRATORY: No cough, shortness of breath, wheezing or hemoptysis.  CARDIOVASCULAR: No chest pain, orthopnea, edema.  GASTROINTESTINAL: No nausea, vomiting, diarrhea or abdominal pain.  GENITOURINARY: No dysuria, hematuria.  ENDOCRINE: No polyuria, nocturia,  HEMATOLOGY: No anemia, easy bruising or bleeding SKIN: No rash or lesion. MUSCULOSKELETAL: No joint pain or arthritis.   NEUROLOGIC: No tingling, numbness, weakness.  PSYCHIATRY: No anxiety or depression.   DRUG ALLERGIES:  No Known Allergies  VITALS:  Blood pressure 136/81, pulse (!) 106, temperature 98.6 F (37 C), temperature source Oral, resp. rate 18, height 5\' 11"  (1.803 m), weight 83.3 kg, SpO2 97 %.  PHYSICAL EXAMINATION:  GENERAL:  47 y.o.-year-old patient lying in the bed with no acute distress.  EYES: Pupils equal, round, reactive to light and accommodation. No scleral icterus. Extraocular muscles intact.  HEENT: Head atraumatic, normocephalic improved trismus, improved floor of the mouth edema, tenderness.  Patient has bilateral submandibular gland hypertrophy, submental lymphadenopathy.. .  NECK:  Supple, no jugular venous distention. No thyroid enlargement, no tenderness.  LUNGS: Normal breath  sounds bilaterally, no wheezing, rales,rhonchi or crepitation. No use of accessory muscles of respiration.  CARDIOVASCULAR: S1, S2 normal. No murmurs, rubs, or gallops.  ABDOMEN: Soft, nontender, nondistended. Bowel sounds present. No organomegaly or mass.  EXTREMITIES: No pedal edema, cyanosis, or clubbing.  NEUROLOGIC: Cranial nerves II through XII are intact. Muscle strength 5/5 in all extremities. Sensation intact. Gait not checked.  PSYCHIATRIC: The patient is alert and oriented x 3.  SKIN: No obvious rash, lesion, or ulcer.    LABORATORY PANEL:   CBC Recent Labs  Lab 04/14/18 0424  WBC 20.8*  HGB 14.1  HCT 41.0  PLT 204   ------------------------------------------------------------------------------------------------------------------  Chemistries  Recent Labs  Lab 04/14/18 0424 04/15/18 0418  NA 134* 139  K 3.3* 4.0  CL 99 109  CO2 29 22  GLUCOSE 151* 140*  BUN 12 13  CREATININE 1.03 0.73  CALCIUM 9.0 8.8*  MG  --  2.0  AST 16  --   ALT 14  --   ALKPHOS 60  --   BILITOT 1.0  --    ------------------------------------------------------------------------------------------------------------------  Cardiac Enzymes Recent Labs  Lab 04/14/18 0424  TROPONINI <0.03   ------------------------------------------------------------------------------------------------------------------  RADIOLOGY:  Ct Soft Tissue Neck W Contrast  Result Date: 04/14/2018 CLINICAL DATA:  LEFT dental pain for 2 days, now with throat pain and swelling. EXAM: CT NECK WITH CONTRAST TECHNIQUE: Multidetector CT imaging of the neck was performed using the standard protocol following the bolus administration of intravenous contrast. CONTRAST:  75mL OMNIPAQUE IOHEXOL 300 MG/ML  SOLN COMPARISON:  None. FINDINGS: PHARYNX AND LARYNX: Mild LEFT hypopharyngeal edema narrowing the LEFT piriform sinus. No extension into floor mouth. SALIVARY GLANDS: Mildly enlarged  edematous LEFT greater than RIGHT  submandibular glands without sialolith, ductal dilatation or mass. Normal parotid glands. THYROID: Normal. LYMPH NODES: Mild reactive lymphadenopathy with reniform morphology, homogeneous enhancement. VASCULAR: Normal. LIMITED INTRACRANIAL: Normal. VISUALIZED ORBITS: Normal. MASTOIDS AND VISUALIZED PARANASAL SINUSES: Mildly atretic RIGHT maxillary sinus with findings of chronic sinusitis. LEFT maxillary mucosal retention cyst. Minimal LEFT mastoid effusion. SKELETON: Nonacute. No CT findings of dental pathology. Moderate C6-7 spondylosis. Moderate RIGHT temporomandibular osteoarthrosis. UPPER CHEST: Lung apices are clear. No superior mediastinal lymphadenopathy. OTHER: Anterior neck subcutaneous fat stranding. Punctate LEFT anterior neck calcification versus radiopaque foreign bodies. IMPRESSION: 1. Acute LEFT greater than RIGHT submandibular sialoadenitis. 2. Transspatial edema sparing floor of mouth. No abscess. Patent airway. 3. Mild reactive lymphadenopathy. 4. Punctate LEFT anterior neck radiopaque foreign body versus calcification, potentially related. Electronically Signed   By: Awilda Metro M.D.   On: 04/14/2018 05:37   Dg Chest Port 1 View  Result Date: 04/15/2018 CLINICAL DATA:  Atelectasis. EXAM: PORTABLE CHEST 1 VIEW COMPARISON:  04/2018 FINDINGS: Cardiomediastinal silhouette is normal. Mediastinal contours appear intact. There is no evidence of pleural effusion or pneumothorax. Improved aeration the lungs. Minimal left lower lobe peribronchial atelectasis versus airspace consolidation. Osseous structures are without acute abnormality. Soft tissues are grossly normal. IMPRESSION: Minimal left lower lobe peribronchial atelectasis versus airspace consolidation. Electronically Signed   By: Ted Mcalpine M.D.   On: 04/15/2018 08:29   Dg Chest Port 1 View  Result Date: 04/14/2018 CLINICAL DATA:  47 year old male with sepsis. EXAM: PORTABLE CHEST 1 VIEW COMPARISON:  None. FINDINGS: The heart  size and mediastinal contours are within normal limits. Both lungs are clear. The visualized skeletal structures are unremarkable. IMPRESSION: No active disease. Electronically Signed   By: Elgie Collard M.D.   On: 04/14/2018 05:09    EKG:   Orders placed or performed during the hospital encounter of 04/14/18  . ED EKG 12-Lead  . ED EKG 12-Lead  . EKG 12-Lead  . EKG 12-Lead  . EKG 12-Lead  . EKG 12-Lead    ASSESSMENT AND PLAN:   47 year old male without any past medical history comes in because oF trismus, unable to open the mouth because of pharyngeal edema status post laryngoscopy by ENT yesterday.  #1 strep pharyngitis, pharyngeal edema, submandibular edema: Improving with IV Unasyn, Decadron, patient is mostly is also much better, no hypoxia, hemodynamically stable, likely discharge home with oral antibiotics, steroids tomorrow.  #2 .SIRS: Slightly elevated lactic acid.  With hypotension on admission, received aggressive hydration, blood pressure better today.  Decrease IV fluids, advance diet today. All the records are reviewed and case discussed with Care Management/Social Workerr. Management plans discussed with the patient, family and they are in agreement.  CODE STATUS: Full code  TOTAL TIME TAKING CARE OF THIS PATIENT: 35 minutes.  More than 50% time spent in counseling, coordination of care POSSIBLE D/C IN 1-2 DAYS, DEPENDING ON CLINICAL CONDITION.   Katha Hamming M.D on 04/15/2018 at 1:36 PM  Between 7am to 6pm - Pager - [phone]  After 6pm go to [email or web address] - password [email or web address]  [facility]  Office  [phone]  CC: Primary care physician; Patient, No Pcp Per   Note: This dictation was prepared with Dragon dictation along with smaller phrase technology. Any transcriptional errors that result from this process are unintentional.

## 2018-04-15 NOTE — Progress Notes (Signed)
..   04/15/2018 9:55 AM  Jeffrey Gillespie [id number]  Hospital Day 2    Temp:  [98 F (36.7 C)-99.1 F (37.3 C)] 98 F (36.7 C) (11/03 0800) Pulse Rate:  [61-93] 84 (11/03 0800) Resp:  [13-28] 22 (11/03 0800) BP: (92-138)/(65-122) 138/122 (11/03 0800) SpO2:  [93 %-100 %] 97 % (11/03 0800) Weight:  [83.3 kg] 83.3 kg (11/02 1030),     Intake/Output Summary (Last 24 hours) at 04/15/2018 0955 Last data filed at 04/15/2018 0810 Gross per 24 hour  Intake 3074.36 ml  Output 1075 ml  Net 1999.36 ml    Results for orders placed or performed during the hospital encounter of 04/14/18 (from the past 24 hour(s))  Urinalysis, Routine w reflex microscopic     Status: Abnormal   Collection Time: 04/14/18 10:33 AM  Result Value Ref Range   Color, Urine YELLOW (A) YELLOW   APPearance CLEAR (A) CLEAR   Specific Gravity, Urine 1.019 1.005 - 1.030   pH 7.0 5.0 - 8.0   Glucose, UA NEGATIVE NEGATIVE mg/dL   Hgb urine dipstick NEGATIVE NEGATIVE   Bilirubin Urine NEGATIVE NEGATIVE   Ketones, ur 5 (A) NEGATIVE mg/dL   Protein, ur NEGATIVE NEGATIVE mg/dL   Nitrite NEGATIVE NEGATIVE   Leukocytes, UA NEGATIVE NEGATIVE  MRSA PCR Screening     Status: None   Collection Time: 04/14/18 10:33 AM  Result Value Ref Range   MRSA by PCR NEGATIVE NEGATIVE  Basic metabolic panel     Status: Abnormal   Collection Time: 04/15/18  4:18 AM  Result Value Ref Range   Sodium 139 135 - 145 mmol/L   Potassium 4.0 3.5 - 5.1 mmol/L   Chloride 109 98 - 111 mmol/L   CO2 22 22 - 32 mmol/L   Glucose, Bld 140 (H) 70 - 99 mg/dL   BUN 13 6 - 20 mg/dL   Creatinine, Ser 4.09 0.61 - 1.24 mg/dL   Calcium 8.8 (L) 8.9 - 10.3 mg/dL   GFR calc non Af Amer >60 >60 mL/min   GFR calc Af Amer >60 >60 mL/min   Anion gap 8 5 - 15  Magnesium     Status: None   Collection Time: 04/15/18  4:18 AM  Result Value Ref Range   Magnesium 2.0 1.7 - 2.4 mg/dL  Phosphorus     Status: Abnormal   Collection Time: 04/15/18  4:18 AM  Result  Value Ref Range   Phosphorus 2.3 (L) 2.5 - 4.6 mg/dL    SUBJECTIVE:  No acute events.  Group A positive.  Continues to have submental swelling.  Improved pain.  Tolerating PO.  OBJECTIVE:  GEN-  NAD RESP-  Unlabored CARD-  RRR OC/OP-  Improved trismus, poor dentition, improved floor of mouth edema and tenderness NECK-  Bilateral firm submental lymphadenopathy, bilateral submandibular gland hypertrophy  IMPRESSION:  Group A strep pharyngitis vs odontogenic cellulitis  PLAN:  OK to transfer to floor per ENT perspective.  Given more prominent lymphadenopathy submentally unclear if resolution of swelling making it more pronounced or if new.  Recommend another day of IV abx/steroids.  Possible conversion to PO tomorrow and d/c if continues to improve at this rate.  [name] 04/15/2018, 9:55 AM

## 2018-04-15 NOTE — Progress Notes (Signed)
Telemetry called stating patient's heart rate has increased ST to the 120's. Patient asymptomatic. MD, Luberta Mutter, paged and made aware. No orders at this time.

## 2018-04-15 NOTE — Progress Notes (Signed)
Initial Nutrition Assessment  DOCUMENTATION CODES:   Not applicable  INTERVENTION:  Provide Boost Breeze po TID, each supplement provides 250 kcal and 9 grams of protein.  Provide daily MVI.  Recommend replacing phosphorus.  Once diet able to be advanced recommend changing ONS to Premier Protein po BID, each supplement provides 160 kcal and 30 grams of protein.  NUTRITION DIAGNOSIS:   Unintentional weight loss related to other (see comment)(inadequate oral intake at detention centers) as evidenced by per patient/family report(report of 16.5% weight loss over 2 months).  GOAL:   Patient will meet greater than or equal to 90% of their needs  MONITOR:   PO intake, Supplement acceptance, Diet advancement, Labs, Weight trends, I & O's  REASON FOR ASSESSMENT:   Malnutrition Screening Tool    ASSESSMENT:   47 year old male with no significant PMHx who is admitted with supraglottic edema and dysphagia.   -Patient underwent trans-nasal flexible laryngoscopy on 11/2 which found improved left arytenoid edema with resolution of bogginess and prolapse into airway, continued but improved left piriform sinus edema and erythema. Widely patent airway.  Met with patient at bedside. He reports he has been in detention centers for the past 2 months. He was initially in a detention center in [place] where he was given only water or sugar water for 2-3 weeks. He reports he was not given any other food or beverages for those weeks and is unsure why. He then transferred to a detention center in [place]. At this detention center he was served 3 meals per day, which he finished all of. He reports these meals were just smaller than the portion sizes he is used to eating. 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 He is reporting significant loss of muscle mass over the past 2 months. Only some mild depletion of temple region found on NFPE.  Patient reports his UBW is  220 lbs and he was at this weight 2 months ago. He is currently 83.3 kg (183.64 lbs). Per his report he has lost approximately 36 lbs (16.5% body weight) over 2 months, which is significant for time frame. However, no weight history in chart so unable to verify this.  Medications reviewed and include: Decadron 4 mg Q6hrs IV, Colace 151m BID, NS with KCl 40 mEq at 125 mL/hr, Unasyn.  Labs reviewed: Phosphorus 2.3.  Patient is at risk for malnutrition in social/environmental circumstances but does not meet criteria for malnutrition at this time.  Discussed with RN. Patient will likely remain on clear liquid diet today.  NUTRITION - FOCUSED PHYSICAL EXAM:    Most Recent Value  Orbital Region  No depletion  Upper Arm Region  Mild depletion  Thoracic and Lumbar Region  No depletion  Buccal Region  No depletion  Temple Region  Mild depletion  Clavicle Bone Region  No depletion  Clavicle and Acromion Bone Region  No depletion  Scapular Bone Region  No depletion  Dorsal Hand  No depletion  Patellar Region  No depletion  Anterior Thigh Region  No depletion  Posterior Calf Region  No depletion  Edema (RD Assessment)  None  Hair  Reviewed  Eyes  Reviewed  Mouth  Reviewed  Skin  Reviewed  Nails  Reviewed     Diet Order:   Diet Order            Diet clear liquid Room service appropriate? Yes; Fluid consistency: Thin  Diet effective now  EDUCATION NEEDS:   No education needs have been identified at this time  Skin:  Skin Assessment: Reviewed RN Assessment  Last BM:  PTA (04/13/2018 per chart)  Height:   Ht Readings from Last 1 Encounters:  04/14/18 5' 11" (1.803 m)   Weight:   Wt Readings from Last 1 Encounters:  04/14/18 83.3 kg   Ideal Body Weight:  78.2 kg  BMI:  Body mass index is 25.61 kg/m.  Estimated Nutritional Needs:   Kcal:  2075-2245 (MSJ x 1.2-1.3)  Protein:  100-115 grams (1.2-1.4 grams/kg)  Fluid:  2.5 L/day (30 mL/kg)  Leanne  Stephens, MS, RD, LDN Office: [phone] Pager: [phone] After Hours/Weekend Pager: [phone]  

## 2018-04-16 LAB — CBC
HEMATOCRIT: 37.4 % — AB (ref 39.0–52.0)
Hemoglobin: 12.6 g/dL — ABNORMAL LOW (ref 13.0–17.0)
MCH: 28.8 pg (ref 26.0–34.0)
MCHC: 33.7 g/dL (ref 30.0–36.0)
MCV: 85.4 fL (ref 80.0–100.0)
NRBC: 0 % (ref 0.0–0.2)
Platelets: 244 10*3/uL (ref 150–400)
RBC: 4.38 MIL/uL (ref 4.22–5.81)
RDW: 13.3 % (ref 11.5–15.5)
WBC: 20.7 10*3/uL — AB (ref 4.0–10.5)

## 2018-04-16 LAB — MUMPS ANTIBODY, IGG: Mumps IgG: 26.8 AU/mL (ref >10.9)

## 2018-04-16 LAB — GLUCOSE, CAPILLARY: Glucose-Capillary: 116 mg/dL — ABNORMAL HIGH (ref 70–99)

## 2018-04-16 LAB — MUMPS ANTIBODY, IGM: Mumps IgM: <0.8 AU (ref 0.00–0.79)

## 2018-04-16 MED ORDER — AMOXICILLIN-POT CLAVULANATE 875-125 MG PO TABS: 1.0000 | ORAL_TABLET | Freq: Two times a day (BID) | ORAL | 0 refills | Status: AC

## 2018-04-16 MED ORDER — PREDNISONE 10 MG (21) PO TBPK: ORAL_TABLET | ORAL | 0 refills | Status: DC

## 2018-04-16 NOTE — Progress Notes (Signed)
..   04/16/2018 7:44 AM  [name] [id number]  Hospital Day 3    Temp:  [98 F (36.7 C)-98.6 F (37 C)] 98.4 F (36.9 C) (11/04 0731) Pulse Rate:  [61-106] 61 (11/04 0731) Resp:  [17-26] 20 (11/04 0731) BP: (86-138)/(55-122) 111/73 (11/04 0731) SpO2:  [94 %-99 %] 94 % (11/04 0731),     Intake/Output Summary (Last 24 hours) at 04/16/2018 0744 Last data filed at 04/16/2018 0400 Gross per 24 hour  Intake 2032.44 ml  Output 1000 ml  Net 1032.44 ml    Results for orders placed or performed during the hospital encounter of 04/14/18 (from the past 24 hour(s))  CBC     Status: Abnormal   Collection Time: 04/16/18  4:04 AM  Result Value Ref Range   WBC 20.7 (H) 4.0 - 10.5 K/uL   RBC 4.38 4.22 - 5.81 MIL/uL   Hemoglobin 12.6 (L) 13.0 - 17.0 g/dL   HCT 95.6 (L) 21.3 - 08.6 %   MCV 85.4 80.0 - 100.0 fL   MCH 28.8 26.0 - 34.0 pg   MCHC 33.7 30.0 - 36.0 g/dL   RDW 57.8 46.9 - 62.9 %   Platelets 244 150 - 400 K/uL   nRBC 0.0 0.0 - 0.2 %    SUBJECTIVE:  No acute events.  Transferred to floor yesterday.  Tachycardia last night.  No intervention required  OBJECTIVE:  GEN-  NAD OC/OP- Improved trismus NECK-  Improved LAD, improved edema and induration of submental and submandibular neck.  Continued bilateral submental lymphadenopathy but more mobile, continued swelling of right submandibular region but improved left.  IMPRESSION:  Pharyngitis with lymphaedenitis Group A strep positive vs viral etiology vs sialoadenititis of bilateral submandibular glands  PLAN:  Continued improved edema and erythema of neck.  Mumps still pending per [facility] results.  OK to discharge home on oral antibiotics and steroid taper per ENT perspective.  Follow up in 1 week.  Katherin Ramey 04/16/2018, 7:44 AM

## 2018-04-16 NOTE — Plan of Care (Signed)

## 2018-04-16 NOTE — Progress Notes (Signed)
Pharyngeal edema, able to open the mouth, will discharge the patient home with Augmentin for 7 days, steroid dose taper.  Patient is to follow-up with ENT as an outpatient in 1 week, mom's test is pending.  Mumps  still pending per [facility] result.

## 2018-04-16 NOTE — Progress Notes (Signed)
Discharge instructions given to pt. IVs removed. Case manager offered pt application to get prescriptions [facility]. Pt said he would just get these medications at [facility] this time where prescriptions were faxed. Pt eating breakfast and dressed. Awaiting ride home for discharge.

## 2018-04-16 NOTE — Care Management Note (Signed)
Case Management Note  Patient Details  Name: Gopal Malter MRN: [id number] Date of Birth: 1970-12-29  Subjective/Objective:  Met with patient at bedside to discuss transition of care. He is uninsured. No PCP. Application given [facility]. [facility] for [facility] up. Patient plan to get his discharge medications at [facility] this visit since they are inexpensive.                   Action/Plan:   Expected Discharge Date:  04/16/18               Expected Discharge Plan:  Home/Self Care  In-House Referral:     Discharge planning Services  CM Consult, Homebound not met per provider, Medication Assistance, [facility]  Post Acute Care Choice:    Choice offered to:  Patient  DME Arranged:    DME Agency:     HH Arranged:    [place] Agency:     Status of Service:  Completed, signed off  If discussed at [employer], dates discussed:    Additional Comments:  Jolly Mango, RN 04/16/2018, 9:45 AM

## 2018-04-17 ENCOUNTER — Inpatient Hospital Stay
Admission: EM | Admit: 2018-04-17 | Discharge: 2018-04-19 | DRG: 603 | Disposition: A | Discharge status: Home / Self Care | Payer: Self-pay | Attending: Internal Medicine | Admitting: Internal Medicine

## 2018-04-17 ENCOUNTER — Emergency Department: Payer: Self-pay

## 2018-04-17 ENCOUNTER — Other Ambulatory Visit: Payer: Self-pay

## 2018-04-17 ENCOUNTER — Encounter: Payer: Self-pay | Admitting: Emergency Medicine

## 2018-04-17 DIAGNOSIS — H70091 Acute mastoiditis with other complications, right ear: Secondary | ICD-10-CM

## 2018-04-17 DIAGNOSIS — J020 Streptococcal pharyngitis: Secondary | ICD-10-CM | POA: Diagnosis present

## 2018-04-17 DIAGNOSIS — K122 Cellulitis and abscess of mouth: Secondary | ICD-10-CM

## 2018-04-17 DIAGNOSIS — L0211 Cutaneous abscess of neck: Secondary | ICD-10-CM

## 2018-04-17 DIAGNOSIS — L03221 Cellulitis of neck: Principal | ICD-10-CM | POA: Diagnosis present

## 2018-04-17 DIAGNOSIS — E876 Hypokalemia: Secondary | ICD-10-CM | POA: Diagnosis present

## 2018-04-17 DIAGNOSIS — L0291 Cutaneous abscess, unspecified: Secondary | ICD-10-CM

## 2018-04-17 DIAGNOSIS — Z7952 Long term (current) use of systemic steroids: Secondary | ICD-10-CM

## 2018-04-17 LAB — COMPREHENSIVE METABOLIC PANEL
ALBUMIN: 3 g/dL — AB (ref 3.5–5.0)
ALT: 30 U/L (ref 0–44)
ANION GAP: 8 (ref 5–15)
AST: 20 U/L (ref 15–41)
Alkaline Phosphatase: 61 U/L (ref 38–126)
BILIRUBIN TOTAL: 0.4 mg/dL (ref 0.3–1.2)
BUN: 19 mg/dL (ref 6–20)
CHLORIDE: 105 mmol/L (ref 98–111)
CO2: 24 mmol/L (ref 22–32)
Calcium: 8.2 mg/dL — ABNORMAL LOW (ref 8.9–10.3)
Creatinine, Ser: 0.69 mg/dL (ref 0.61–1.24)
GFR calc Af Amer: >60 mL/min (ref >60)
GFR calc non Af Amer: >60 mL/min (ref >60)
GLUCOSE: 119 mg/dL — AB (ref 70–99)
POTASSIUM: 3.4 mmol/L — AB (ref 3.5–5.1)
Sodium: 137 mmol/L (ref 135–145)
TOTAL PROTEIN: 6.5 g/dL (ref 6.5–8.1)

## 2018-04-17 LAB — CBC WITH DIFFERENTIAL/PLATELET
ABS IMMATURE GRANULOCYTES: 0.26 10*3/uL — AB (ref 0.00–0.07)
Basophils Absolute: 0.1 10*3/uL (ref 0.0–0.1)
Basophils Relative: 0 %
Eosinophils Absolute: 0 10*3/uL (ref 0.0–0.5)
Eosinophils Relative: 0 %
HEMATOCRIT: 39 % (ref 39.0–52.0)
HEMOGLOBIN: 13.4 g/dL (ref 13.0–17.0)
IMMATURE GRANULOCYTES: 2 %
LYMPHS ABS: 2.2 10*3/uL (ref 0.7–4.0)
LYMPHS PCT: 13 %
MCH: 29.1 pg (ref 26.0–34.0)
MCHC: 34.4 g/dL (ref 30.0–36.0)
MCV: 84.8 fL (ref 80.0–100.0)
MONO ABS: 1.5 10*3/uL — AB (ref 0.1–1.0)
MONOS PCT: 8 %
NEUTROS ABS: 13.4 10*3/uL — AB (ref 1.7–7.7)
NEUTROS PCT: 77 %
Platelets: 287 10*3/uL (ref 150–400)
RBC: 4.6 MIL/uL (ref 4.22–5.81)
RDW: 13.4 % (ref 11.5–15.5)
WBC: 17.4 10*3/uL — ABNORMAL HIGH (ref 4.0–10.5)
nRBC: 0 % (ref 0.0–0.2)

## 2018-04-17 MED ORDER — SODIUM CHLORIDE 0.9 % IV SOLN
3.0000 g | Freq: Once | INTRAVENOUS | Status: AC
  Administered 2018-04-17: 3 g via INTRAVENOUS
  Filled 2018-04-17: qty 3

## 2018-04-17 MED ORDER — HYDROCODONE-ACETAMINOPHEN 5-325 MG PO TABS
1.0000 | ORAL_TABLET | ORAL | Status: DC | PRN
  Administered 2018-04-17: 1 via ORAL
  Filled 2018-04-17: qty 1

## 2018-04-17 MED ORDER — ALBUTEROL SULFATE (2.5 MG/3ML) 0.083% IN NEBU: 2.5000 mg | INHALATION_SOLUTION | RESPIRATORY_TRACT | Status: DC | PRN

## 2018-04-17 MED ORDER — IOHEXOL 300 MG/ML  SOLN
75.0000 mL | Freq: Once | INTRAMUSCULAR | Status: AC | PRN
  Administered 2018-04-17: 75 mL via INTRAVENOUS
  Filled 2018-04-17: qty 75

## 2018-04-17 MED ORDER — BISACODYL 5 MG PO TBEC: 5.0000 mg | DELAYED_RELEASE_TABLET | Freq: Every day | ORAL | Status: DC | PRN

## 2018-04-17 MED ORDER — SODIUM CHLORIDE 0.9 % IV BOLUS
1000.0000 mL | Freq: Once | INTRAVENOUS | Status: AC
  Administered 2018-04-17: 1000 mL via INTRAVENOUS

## 2018-04-17 MED ORDER — ACETAMINOPHEN 650 MG RE SUPP: 650.0000 mg | Freq: Four times a day (QID) | RECTAL | Status: DC | PRN

## 2018-04-17 MED ORDER — ONDANSETRON HCL 4 MG/2ML IJ SOLN: 4.0000 mg | Freq: Four times a day (QID) | INTRAMUSCULAR | Status: DC | PRN

## 2018-04-17 MED ORDER — ACETAMINOPHEN 325 MG PO TABS: 650.0000 mg | ORAL_TABLET | Freq: Four times a day (QID) | ORAL | Status: DC | PRN

## 2018-04-17 MED ORDER — DEXAMETHASONE SODIUM PHOSPHATE 10 MG/ML IJ SOLN
10.0000 mg | Freq: Once | INTRAMUSCULAR | Status: AC
  Administered 2018-04-17: 10 mg via INTRAVENOUS
  Filled 2018-04-17: qty 1

## 2018-04-17 MED ORDER — DEXAMETHASONE SODIUM PHOSPHATE 4 MG/ML IJ SOLN
4.0000 mg | Freq: Three times a day (TID) | INTRAMUSCULAR | Status: DC
  Filled 2018-04-17 (×2): qty 1

## 2018-04-17 MED ORDER — POTASSIUM CHLORIDE CRYS ER 20 MEQ PO TBCR
40.0000 meq | EXTENDED_RELEASE_TABLET | Freq: Once | ORAL | Status: AC
  Administered 2018-04-17: 40 meq via ORAL
  Filled 2018-04-17: qty 2

## 2018-04-17 MED ORDER — ENOXAPARIN SODIUM 40 MG/0.4ML ~~LOC~~ SOLN
40.0000 mg | SUBCUTANEOUS | Status: DC
  Administered 2018-04-17 – 2018-04-18 (×2): 40 mg via SUBCUTANEOUS
  Filled 2018-04-17 (×2): qty 0.4

## 2018-04-17 MED ORDER — SODIUM CHLORIDE 0.9% FLUSH
3.0000 mL | Freq: Two times a day (BID) | INTRAVENOUS | Status: DC
  Administered 2018-04-18 – 2018-04-19 (×4): 3 mL via INTRAVENOUS

## 2018-04-17 MED ORDER — SODIUM CHLORIDE 0.9 % IV SOLN
250.0000 mL | INTRAVENOUS | Status: DC | PRN
  Administered 2018-04-18: 250 mL via INTRAVENOUS

## 2018-04-17 MED ORDER — SODIUM CHLORIDE 0.9% FLUSH: 3.0000 mL | INTRAVENOUS | Status: DC | PRN

## 2018-04-17 MED ORDER — SENNOSIDES-DOCUSATE SODIUM 8.6-50 MG PO TABS: 1.0000 | ORAL_TABLET | Freq: Every evening | ORAL | Status: DC | PRN

## 2018-04-17 MED ORDER — ONDANSETRON HCL 4 MG PO TABS: 4.0000 mg | ORAL_TABLET | Freq: Four times a day (QID) | ORAL | Status: DC | PRN

## 2018-04-17 MED ORDER — DEXAMETHASONE SODIUM PHOSPHATE 4 MG/ML IJ SOLN
4.0000 mg | Freq: Three times a day (TID) | INTRAMUSCULAR | Status: DC
  Administered 2018-04-18 – 2018-04-19 (×5): 4 mg via INTRAVENOUS
  Filled 2018-04-17 (×6): qty 1

## 2018-04-17 MED ORDER — SODIUM CHLORIDE 0.9 % IV SOLN
3.0000 g | Freq: Three times a day (TID) | INTRAVENOUS | Status: DC
  Administered 2018-04-18 (×3): 3 g via INTRAVENOUS
  Filled 2018-04-17 (×5): qty 3

## 2018-04-17 NOTE — ED Notes (Signed)
Lupita Leash RN, aware of bed assigned

## 2018-04-17 NOTE — ED Notes (Signed)
Pt c/o neck swelling with tenderness for the past 4 days, was admitted on 11/2 for the same and discharged states the day of discharge it was slightly better but over night it has increased in size and becoming hard. With noted redness . States he had difficutly swallowing the medications prescribed to him.

## 2018-04-17 NOTE — ED Provider Notes (Signed)
[facility] Emergency Department Provider Note  ___________________________________________   First MD Initiated Contact with Patient 04/17/18 1815     (approximate)  I have reviewed the triage vital signs and the nursing notes.   HISTORY  Chief Complaint Facial Swelling   HPI Jeffrey Gillespie is a 47 y.o. 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  The patient says that he was discharged just yesterday and took 2 doses of Augmentin and was only able to tolerate 30 mg of prednisone before he stopped having the ability to swallow pills.  He says that he is having difficulty swallowing because of the pain.  Denies any difficulty breathing at this time.  No fevers at this time.  Was strep positive during his visit and tested for mumps but without any results back at the time of discharge.   History reviewed. No pertinent past medical history.  Patient Active Problem List   Diagnosis Date Noted  . Pharyngeal edema 04/14/2018    Past Surgical History:  Procedure Laterality Date  . none      Prior to Admission medications   Medication Sig Start Date End Date Taking? Authorizing Provider  amoxicillin-clavulanate (AUGMENTIN) 875-125 MG tablet Take 1 tablet by mouth 2 (two) times daily for 7 days. 04/16/18 04/23/18  Katha Hamming, MD  predniSONE (STERAPRED UNI-PAK 21 TAB) 10 MG (21) TBPK tablet Taper by 10 mg daily 04/16/18   Katha Hamming, MD    Allergies Patient has no known allergies.  Family History  Problem Relation Age of Onset  . Diabetes Mellitus II Neg Hx   . CAD Neg Hx     Social History Social History   Tobacco Use  . Smoking status: Never Smoker  . Smokeless tobacco: Never Used  Substance Use Topics  . Alcohol use: Not on file  . Drug use: Not on file    Review of Systems  Constitutional: No fever/chills Eyes: No  visual changes. ENT: As above Cardiovascular: Denies chest pain. Respiratory: Denies shortness of breath. Gastrointestinal: No abdominal pain.  No nausea, no vomiting.  No diarrhea.  No constipation. Genitourinary: Negative for dysuria. Musculoskeletal: Negative for back pain. Skin: Negative for rash. Neurological: Negative for headaches, focal weakness or numbness.   ____________________________________________   PHYSICAL EXAM:  VITAL SIGNS: ED Triage Vitals  Enc Vitals Group     BP 04/17/18 1712 105/68     Pulse Rate 04/17/18 1712 92     Resp 04/17/18 1712 18     Temp 04/17/18 1712 98.3 F (36.8 C)     Temp Source 04/17/18 1712 Oral     SpO2 04/17/18 1712 99 %     Weight 04/17/18 1714 183 lb 10.3 oz (83.3 kg)     Height 04/17/18 1714 5\' 11"  (1.803 m)     Head Circumference --      Peak Flow --      Pain Score 04/17/18 1713 7     Pain Loc --      Pain Edu? --      Excl. in GC? --     Constitutional: Alert and oriented. Well appearing and in no acute distress. Eyes: Conjunctivae are normal.  Head: Atraumatic. Nose: No congestion/rhinnorhea. Mouth/Throat: Mucous membranes are moist.  No pharyngeal erythema.  No exudate. Neck: No stridor.  Patient with generalized swelling to the submandibular region with induration.  Mild tenderness to palpation with mild warmth.  No fluctuance. Cardiovascular: Normal rate, regular rhythm. Grossly normal heart sounds.   Respiratory: Normal respiratory effort.  No retractions. Lungs CTAB. Gastrointestinal: Soft and nontender. No distention.  Musculoskeletal: No lower extremity tenderness nor edema.  No joint effusions. Neurologic:  Normal speech and language. No gross focal neurologic deficits are appreciated. Skin:  Skin is warm, dry and intact. No rash noted. Psychiatric: Mood and affect are normal. Speech and behavior are normal.  ____________________________________________   LABS (all labs ordered are listed, but only abnormal  results are displayed)  Labs Reviewed  CBC WITH DIFFERENTIAL/PLATELET - Abnormal; Notable for the following components:      Result Value   WBC 17.4 (*)    Neutro Abs 13.4 (*)    Monocytes Absolute 1.5 (*)    Abs Immature Granulocytes 0.26 (*)    All other components within normal limits  COMPREHENSIVE METABOLIC PANEL - Abnormal; Notable for the following components:   Potassium 3.4 (*)    Glucose, Bld 119 (*)    Calcium 8.2 (*)    Albumin 3.0 (*)    All other components within normal limits   ____________________________________________  EKG   ____________________________________________  RADIOLOGY   ____________________________________________   PROCEDURES  Procedure(s) performed:   Procedures  Critical Care performed:   ____________________________________________   INITIAL IMPRESSION / ASSESSMENT AND PLAN / ED COURSE  Pertinent labs & imaging results that were available during my care of the patient were reviewed by me and considered in my medical decision making (see chart for details).  DDX: Ludwig's angina, abscess, cellulitis, mumps, sepsis, airway compromise As part of my medical decision making, I reviewed the following data within the electronic MEDICAL RECORD NUMBER Notes from prior ED visits  ----------------------------------------- 7:31 PM on 04/17/2018 -----------------------------------------  Discussed case Dr. Jenne Campus over the phone.  We discussed the possibility of reimaging as well as inpatient admission with IV antibiotics and steroids versus discharge to home with follow-up in the office.  Based off the patient's worsening symptoms as well as a persistently elevated white blood count shows to be admitted to the hospital.  He will be given a dose of IV Unasyn as well as Decadron.  Signed out to Dr. Imogene Burn.  Patient with reimaging of his neck undergoing at this time.  Patient understanding of the diagnosis and treatment plan.  Interpreter, [place],  utilized for interactions. 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  NEW MEDICATIONS STARTED DURING THIS VISIT:  New Prescriptions   No medications on file     Note:  This document was prepared using Dragon voice recognition software and may include unintentional dictation errors.     Myrna Blazer, MD 04/17/18 [phone]

## 2018-04-17 NOTE — ED Triage Notes (Signed)
Pt states that he was here on the 2nd and admitted for the swelling in his neck, he reports that he got better and they let him go home. This am he woke up and had the swelling back. Pt more swollen on the left side of his neck. States that it is hard to swallow.

## 2018-04-17 NOTE — H&P (Signed)
[facility] - [place] at [facility]   PATIENT NAME: Jeffrey Gillespie    MR#:  [id number]  DATE OF BIRTH:  08/19/1970  DATE OF ADMISSION:  04/17/2018  PRIMARY CARE PHYSICIAN: Patient, No Pcp Per   REQUESTING/REFERRING PHYSICIAN: Dr. Pershing Proud.  CHIEF COMPLAINT:   Chief Complaint  Patient presents with  . Facial Swelling   Worsening neck pain and swelling HISTORY OF PRESENT ILLNESS:  Jeffrey Gillespie  is a 47 y.o. male with no past medical history.  He was admitted for Group A strep pharyngitis vs odontogenic cellulitis 2 days ago, treated with Unasyn and Decadron.  He was discharged with Augmentin and prednisone yesterday.  He complains of facial and neck pain and swelling.  He also complains of mild dysphagia and shortness of breath. ED physician Dr. Pershing Proud discussed with on-call ENT physician, who suggest admit the patient.  PAST MEDICAL HISTORY:  History reviewed. No pertinent past medical history.  PAST SURGICAL HISTORY:   Past Surgical History:  Procedure Laterality Date  . none      SOCIAL HISTORY:   Social History   Tobacco Use  . Smoking status: Never Smoker  . Smokeless tobacco: Never Used  Substance Use Topics  . Alcohol use: Not on file    FAMILY HISTORY:   Family History  Problem Relation Age of Onset  . Diabetes Mellitus II Neg Hx   . CAD Neg Hx     DRUG ALLERGIES:  No Known Allergies  REVIEW OF SYSTEMS:   Review of Systems  Constitutional: Positive for chills and fever. Negative for malaise/fatigue.  HENT: Negative for sore throat.   Eyes: Negative for blurred vision and double vision.  Respiratory: Negative for cough, hemoptysis, shortness of breath, wheezing and stridor.   Cardiovascular: Negative for chest pain, palpitations, orthopnea and leg swelling.  Gastrointestinal: Negative for abdominal pain, blood in stool, diarrhea, melena, nausea and vomiting.  Genitourinary: Negative for dysuria, flank pain and hematuria.   Musculoskeletal: Positive for neck pain. Negative for back pain and joint pain.        facial and neck pain and swelling.   Skin: Negative for rash.  Neurological: Negative for dizziness, sensory change, focal weakness, seizures, loss of consciousness, weakness and headaches.  Endo/Heme/Allergies: Negative for polydipsia.  Psychiatric/Behavioral: Negative for depression. The patient is not nervous/anxious.     MEDICATIONS AT HOME:   Prior to Admission medications   Medication Sig Start Date End Date Taking? Authorizing Provider  amoxicillin-clavulanate (AUGMENTIN) 875-125 MG tablet Take 1 tablet by mouth 2 (two) times daily for 7 days. 04/16/18 04/23/18  Katha Hamming, MD  predniSONE (STERAPRED UNI-PAK 21 TAB) 10 MG (21) TBPK tablet Taper by 10 mg daily 04/16/18   Katha Hamming, MD      VITAL SIGNS:  Blood pressure 105/68, pulse 92, temperature 98.3 F (36.8 C), temperature source Oral, resp. rate 18, height 5\' 11"  (1.803 m), weight 83.3 kg, SpO2 99 %.  PHYSICAL EXAMINATION:  Physical Exam  GENERAL:  47 y.o.-year-old patient lying in the bed with no acute distress.  EYES: Pupils equal, round, reactive to light and accommodation. No scleral icterus. Extraocular muscles intact.  HEENT: Head atraumatic, normocephalic. Oropharynx and nasopharynx clear.  NECK:  Supple, no jugular venous distention. facial and neck tenderness and swelling.  LUNGS: Normal breath sounds bilaterally, no wheezing, rales,rhonchi or crepitation. No use of accessory muscles of respiration.  CARDIOVASCULAR: S1, S2 normal. No murmurs, rubs, or gallops.  ABDOMEN: Soft, nontender, nondistended. Bowel sounds  present. No organomegaly or mass.  EXTREMITIES: No pedal edema, cyanosis, or clubbing.  NEUROLOGIC: Cranial nerves II through XII are intact. Muscle strength 5/5 in all extremities. Sensation intact. Gait not checked.  PSYCHIATRIC: The patient is alert and oriented x 3.  SKIN: No obvious rash,  lesion, or ulcer.   LABORATORY PANEL:   CBC Recent Labs  Lab 04/17/18 1845  WBC 17.4*  HGB 13.4  HCT 39.0  PLT 287   ------------------------------------------------------------------------------------------------------------------  Chemistries  Recent Labs  Lab 04/15/18 0418 04/17/18 1845  NA 139 137  K 4.0 3.4*  CL 109 105  CO2 22 24  GLUCOSE 140* 119*  BUN 13 19  CREATININE 0.73 0.69  CALCIUM 8.8* 8.2*  MG 2.0  --   AST  --  20  ALT  --  30  ALKPHOS  --  61  BILITOT  --  0.4   ------------------------------------------------------------------------------------------------------------------  Cardiac Enzymes Recent Labs  Lab 04/14/18 0424  TROPONINI <0.03   ------------------------------------------------------------------------------------------------------------------  RADIOLOGY:  No results found.    IMPRESSION AND PLAN:   Group A strep pharyngitis vs odontogenic cellulitis with leukocytosis, rule out Ludwig angina Patient will be placed for observation. Continue Unasyn and Decadron IV per ENT physician.  Follow-up CAT scan of the head and neck.  Follow-up ENT physician consult. Follow-up CBC.  Hypokalemia.  Potassium supplement.  All the records are reviewed and case discussed with ED provider. Management plans discussed with the patient, family and they are in agreement.  CODE STATUS: Full code  TOTAL TIME TAKING CARE OF THIS PATIENT: 35 minutes.    Shaune Pollack M.D on 04/17/2018 at 7:37 PM  Between 7am to 6pm - Pager - [phone]  After 6pm go to [email or web address] - Social research officer, government  [facility]  Office  [phone]  CC: Primary care physician; Patient, No Pcp Per   Note: This dictation was prepared with Dragon dictation along with smaller phrase technology. Any transcriptional errors that result from this process are unin

## 2018-04-17 NOTE — ED Notes (Signed)
Per interpreter pt was here on the 2nd discharged yesterday, he noticed that he was swollen called his MD and they told him to come back here. He was put on antibiotics and medications for the pain and swelling but reports that it is not helping.  Was told by his MD to come here for evalualtion

## 2018-04-18 DIAGNOSIS — K1121 Acute sialoadenitis: Secondary | ICD-10-CM

## 2018-04-18 DIAGNOSIS — L0211 Cutaneous abscess of neck: Secondary | ICD-10-CM

## 2018-04-18 DIAGNOSIS — B950 Streptococcus, group A, as the cause of diseases classified elsewhere: Secondary | ICD-10-CM

## 2018-04-18 LAB — CBC
HCT: 40 % (ref 39.0–52.0)
Hemoglobin: 13 g/dL (ref 13.0–17.0)
MCH: 28.3 pg (ref 26.0–34.0)
MCHC: 32.5 g/dL (ref 30.0–36.0)
MCV: 87.1 fL (ref 80.0–100.0)
Platelets: 297 10*3/uL (ref 150–400)
RBC: 4.59 MIL/uL (ref 4.22–5.81)
RDW: 13.3 % (ref 11.5–15.5)
WBC: 14.9 10*3/uL — ABNORMAL HIGH (ref 4.0–10.5)
nRBC: 0 % (ref 0.0–0.2)

## 2018-04-18 LAB — BASIC METABOLIC PANEL
Anion gap: 6 (ref 5–15)
BUN: 15 mg/dL (ref 6–20)
CALCIUM: 8.7 mg/dL — AB (ref 8.9–10.3)
CO2: 28 mmol/L (ref 22–32)
Chloride: 107 mmol/L (ref 98–111)
Creatinine, Ser: 0.7 mg/dL (ref 0.61–1.24)
GFR calc Af Amer: >60 mL/min (ref >60)
GLUCOSE: 159 mg/dL — AB (ref 70–99)
Potassium: 4.2 mmol/L (ref 3.5–5.1)
Sodium: 141 mmol/L (ref 135–145)

## 2018-04-18 MED ORDER — SODIUM CHLORIDE 0.9 % IV SOLN
3.0000 g | Freq: Four times a day (QID) | INTRAVENOUS | Status: DC
  Administered 2018-04-19 (×3): 3 g via INTRAVENOUS
  Filled 2018-04-18 (×7): qty 3

## 2018-04-18 NOTE — Progress Notes (Signed)
[facility] [place] [facility]   PATIENT NAME: Jeffrey Gillespie    MR#:  [id number]  DATE OF BIRTH:  1971-05-22  SUBJECTIVE:   Patient came in with increasing swelling in the left part of his neck along with pain. He was just discharge couple days ago with prednisone and Augmentin. No fever. Patient able to swallow soft diet. No shortness of breath. REVIEW OF SYSTEMS:   Review of Systems  Constitutional: Negative for chills, fever and weight loss.  HENT: Positive for sore throat. Negative for ear discharge, ear pain and nosebleeds.   Eyes: Negative for blurred vision, pain and discharge.  Respiratory: Negative for sputum production, shortness of breath, wheezing and stridor.   Cardiovascular: Negative for chest pain, palpitations, orthopnea and PND.  Gastrointestinal: Negative for abdominal pain, diarrhea, nausea and vomiting.  Genitourinary: Negative for frequency and urgency.  Musculoskeletal: Negative for back pain and joint pain.  Neurological: Negative for sensory change, speech change, focal weakness and weakness.  Psychiatric/Behavioral: Negative for depression and hallucinations. The patient is not nervous/anxious.    Tolerating Diet: yes Tolerating PT: not needed  DRUG ALLERGIES:  No Known Allergies  VITALS:  Blood pressure 108/72, pulse 64, temperature 98.3 F (36.8 C), temperature source Oral, resp. rate 18, height 5\' 11"  (1.803 m), weight 83.2 kg, SpO2 97 %.  PHYSICAL EXAMINATION:   Physical Exam  GENERAL:  47 y.o.-year-old patient lying in the bed with no acute distress.  EYES: Pupils equal, round, reactive to light and accommodation. No scleral icterus. Extraocular muscles intact.  HEENT: Head atraumatic, normocephalic. Oropharynx and nasopharynx clear.  NECK:  Supple, no jugular venous distention. No thyroid enlargement, no tenderness. Left neck fullness with swelling and mild tenderness. LUNGS: Normal breath sounds bilaterally, no  wheezing, rales, rhonchi. No use of accessory muscles of respiration.  CARDIOVASCULAR: S1, S2 normal. No murmurs, rubs, or gallops.  ABDOMEN: Soft, nontender, nondistended. Bowel sounds present. No organomegaly or mass.  EXTREMITIES: No cyanosis, clubbing or edema b/l.    NEUROLOGIC: Cranial nerves II through XII are intact. No focal Motor or sensory deficits b/l.   PSYCHIATRIC:  patient is alert and oriented x 3.  SKIN: No obvious rash, lesion, or ulcer.   LABORATORY PANEL:  CBC Recent Labs  Lab 04/18/18 0310  WBC 14.9*  HGB 13.0  HCT 40.0  PLT 297    Chemistries  Recent Labs  Lab 04/15/18 0418 04/17/18 1845 04/18/18 0310  NA 139 137 141  K 4.0 3.4* 4.2  CL 109 105 107  CO2 22 24 28   GLUCOSE 140* 119* 159*  BUN 13 19 15   CREATININE 0.73 0.69 0.70  CALCIUM 8.8* 8.2* 8.7*  MG 2.0  --   --   AST  --  20  --   ALT  --  30  --   ALKPHOS  --  61  --   BILITOT  --  0.4  --    Cardiac Enzymes Recent Labs  Lab 04/14/18 0424  TROPONINI <0.03   RADIOLOGY:  Ct Soft Tissue Neck W Contrast  Result Date: 04/17/2018 CLINICAL DATA:  47 y/o  M; neck swelling and tenderness for 4 days. EXAM: CT NECK WITH CONTRAST TECHNIQUE: Multidetector CT imaging of the neck was performed using the standard protocol following the bolus administration of intravenous contrast. CONTRAST:  75mL OMNIPAQUE IOHEXOL 300 MG/ML  SOLN COMPARISON:  04/14/2018 CT of the neck. FINDINGS: Pharynx and larynx: Decreased mucosal thickening of the oropharynx. No exophytic  mass. Salivary glands: Persistent mild enhancement of the bilateral submandibular glands. No abnormality of parotid glands. Thyroid: Normal. Lymph nodes: Bilateral anterior cervical lymphadenopathy, likely reactive. No lymph node necrosis. Vascular: Negative. Limited intracranial: Negative. Visualized orbits: Negative. Mastoids and visualized paranasal sinuses: Bilateral maxillary sinus mucous retention cyst. Normal aeration of the mastoid air cells.  Orbits are unremarkable. Skeleton: No acute or aggressive process. Upper chest: Negative. Other: Inflammatory changes within the bilateral submandibular compartments, anterior cervical triangles, and the superficial soft tissues of the anterior neck is markedly increased from the prior CT. Additionally, there is a developing fluid collection within the right strap muscles measuring 1.9 x 2.4 x 4.6 cm (volume = 11 cm^3) (AP x ML x CC series 7, image 73 and series 2, image 77). IMPRESSION: 1. Inflammation within the bilateral submandibular compartments and diffusely throughout the soft tissues of the anterior neck are markedly increased. Additionally, there is a new fluid collection within the right strap muscles measuring up to 4.6 cm (11 cc), probably a phlegmon/developing abscess. 2. Reactive bilateral anterior cervical lymphadenopathy. Electronically Signed   By: Mitzi Hansen M.D.   On: 04/17/2018 19:58   ASSESSMENT AND PLAN:  Jeffrey Gillespie  is a 48 y.o. male with no past medical history.  He was admitted for Group A strep pharyngitis vs odontogenic cellulitis 2 days ago, treated with Unasyn and Decadron.  He was discharged with Augmentin and prednisone yesterday.  He complains of facial and neck pain and swelling.    *Acute  Pharyngitis\with leukocytosis -Continue Unasyn and Decadron IV per ENT physician.   -CT scan shows worsening collection of fluid on the right strap muscle measuring 001.001.001.001 cm -ENT discussed with IR to see if fluid is drainable. it feels more edema than fluid -ID consultation requested -patient hemodynamically stable. Able to swallow. No respiratory distress.  *Leukocytosis  *Hypokalemia.  Potassium supplement. -Corrected   Case discussed with Care Management/Social Worker. Management plans discussed with the patient, family and they are in agreement.  CODE STATUS: FULL  DVT Prophylaxis: lovneox  TOTAL TIME TAKING CARE OF THIS PATIENT: *40* minutes.   >50% time spent on counselling and coordination of care  POSSIBLE D/C IN *1-2* DAYS, DEPENDING ON CLINICAL CONDITION.  Note: This dictation was prepared with Dragon dictation along with smaller phrase technology. Any transcriptional errors that result from this process are unintentional.  Enedina Finner M.D on 04/18/2018 at 2:06 PM  Between 7am to 6pm - Pager - [phone]  After 6pm go to [email or web address] - password [email or web address]  [facility]  Office  [phone]  CC: Primary care physician; Patient, No Pcp PerPatient ID: Jeffrey Gillespie, male   DOB: [birth year], 47 y.o.   MRN: [id number]

## 2018-04-18 NOTE — Discharge Summary (Signed)
Jeffrey Gillespie, is a 47 y.o. male  DOB 1970-11-18  MRN [id number].  Admission date:  04/14/2018  Admitting Physician  Arnaldo Natal, MD  Discharge Date:  04/16/2018   Primary MD  Patient, No Pcp Per  Recommendations for primary care physician for things to follow:  Follow-up with ENT as an outpatient 1 week.   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 No pertinent past medical history.  Past Surgical History:  Procedure Laterality Date  . none         History of present illness and  Hospital Course:     Kindly see H&P for history of present illness and admission details, please review complete Labs, Consult reports and Test reports for all details in brief  HPI  from the history and physical done on the day of admission 36-year-old male with no past medical history admitted for severe jaw pain, neck pain, unable to open the mouth, patient had significant swelling in the posterior pharyngeal area, admitted to stepdown status for impending respiratory failure.   Hospital Course  47 year old male with pharyngeal edema, submandibular edema, trismus unable to open the mouth when he is admitted ; admitted to ICU initially then transferred to the medical unit, patient did not have hypoxia, seen by ENT, received IV Unasyn, IV Decadron, patient neck showed improved edema, induration of submental and submandibular neck area, ENT recommended discharging home with oral antibiotics, steroid Dosepak, discharged  home with Augmentin for 7 days, steroid Dosepak.  Patient really felt better with trismus able to open the mouth, decreased swelling of the submandibular area.  And is advised to follow-up with ENT as an outpatient in 1 week regarding Mumps results. 2.  Strep pharyngitis, patient received IV Unasyn in the hospital, discharged home with Augmentin. #3 Sirs on admission with tachycardia, elevated white count improved with IV fluids, IV antibiotics   Discharge Condition: Stable  Follow UP  Follow-up Information    Schedule an appointment as soon as possible [facility]   Why:          his dentist Follow up in 1 week(s).   Why:  pt can make his own        Bud Face, MD. Schedule an appointment as soon as possible for a visit on 04/23/2018.   Specialty:  Otolaryngology Why:  at [address] Contact information: [address] [place] [address] [phone]             Discharge Instructions  and  Discharge Medications      Allergies as of 04/16/2018   No Known Allergies     Medication List    TAKE these medications   amoxicillin-clavulanate 875-125 MG tablet Commonly known as:  AUGMENTIN Take 1 tablet by mouth 2 (two) times daily for 7 days.   predniSONE 10 MG (21) Tbpk tablet Commonly known as:  STERAPRED UNI-PAK 21 TAB Taper by 10 mg daily         Diet and Activity recommendation: See Discharge Instructions above   Consults obtained ;ENT   Major procedures and Radiology Reports - PLEASE review detailed and final reports for all details, in brief -  Ct Soft Tissue Neck W Contrast  Result Date: 04/17/2018 CLINICAL DATA:  47 y/o  M; neck swelling and tenderness for 4 days. EXAM: CT NECK WITH CONTRAST TECHNIQUE: Multidetector CT imaging of the neck was performed using the standard protocol following the bolus administration of intravenous contrast. CONTRAST:  75mL OMNIPAQUE IOHEXOL 300 MG/ML  SOLN  COMPARISON:  04/14/2018 CT of the neck. FINDINGS: Pharynx and larynx: Decreased mucosal thickening of the oropharynx. No exophytic mass. Salivary glands: Persistent mild enhancement of the bilateral submandibular glands. No abnormality of parotid glands. Thyroid: Normal. Lymph nodes: Bilateral anterior cervical lymphadenopathy, likely reactive. No lymph node necrosis. Vascular: Negative. Limited intracranial: Negative. Visualized orbits: Negative. Mastoids and visualized paranasal sinuses: Bilateral maxillary sinus mucous retention cyst. Normal aeration of the mastoid air cells. Orbits are unremarkable. Skeleton: No acute or aggressive process. Upper chest: Negative. Other: Inflammatory changes within the bilateral submandibular compartments, anterior cervical triangles, and the superficial soft tissues of the anterior neck is markedly increased from the prior CT. Additionally, there is a developing fluid collection within the right strap muscles measuring 1.9 x 2.4 x 4.6 cm (volume = 11 cm^3) (AP x ML x CC series 7, image 73 and series 2, image 77). IMPRESSION: 1. Inflammation within the bilateral submandibular compartments and diffusely throughout the soft tissues of the anterior neck are markedly increased. Additionally, there is a new fluid collection within the right strap muscles measuring up to 4.6 cm (11 cc), probably a phlegmon/developing abscess. 2. Reactive bilateral anterior cervical lymphadenopathy. Electronically Signed   By: Mitzi Hansen M.D.   On: 04/17/2018 19:58   Ct Soft Tissue Neck W Contrast  Result Date: 04/14/2018 CLINICAL DATA:  LEFT dental pain for 2 days, now with throat pain and swelling. EXAM: CT NECK WITH CONTRAST TECHNIQUE: Multidetector CT imaging of the neck was performed using the standard protocol following the bolus administration of intravenous contrast. CONTRAST:  75mL OMNIPAQUE IOHEXOL 300 MG/ML  SOLN COMPARISON:  None. FINDINGS: PHARYNX AND LARYNX: Mild LEFT  hypopharyngeal edema narrowing the LEFT piriform sinus. No extension into floor mouth. SALIVARY GLANDS: Mildly enlarged edematous LEFT greater than RIGHT submandibular glands without sialolith, ductal dilatation or mass. Normal parotid glands. THYROID: Normal. LYMPH NODES: Mild reactive lymphadenopathy with reniform morphology, homogeneous enhancement. VASCULAR: Normal. LIMITED INTRACRANIAL: Normal. VISUALIZED ORBITS: Normal. MASTOIDS AND VISUALIZED PARANASAL SINUSES: Mildly atretic RIGHT maxillary sinus with findings of chronic sinusitis. LEFT maxillary mucosal retention cyst. Minimal LEFT mastoid effusion. SKELETON: Nonacute. No CT findings of dental pathology. Moderate C6-7 spondylosis. Moderate RIGHT temporomandibular osteoarthrosis. UPPER CHEST: Lung apices are clear. No superior mediastinal lymphadenopathy. OTHER: Anterior neck subcutaneous fat stranding. Punctate LEFT anterior neck calcification versus radiopaque foreign bodies. IMPRESSION: 1. Acute LEFT greater than RIGHT submandibular sialoadenitis. 2. Transspatial edema sparing floor of mouth. No abscess. Patent airway. 3. Mild reactive lymphadenopathy. 4. Punctate LEFT anterior neck radiopaque foreign body versus calcification, potentially related. Electronically Signed   By: Awilda Metro M.D.   On: 04/14/2018 05:37   Dg Chest Port 1 View  Result Date: 04/15/2018 CLINICAL DATA:  Atelectasis. EXAM: PORTABLE CHEST 1 VIEW COMPARISON:  04/2018 FINDINGS: Cardiomediastinal silhouette is normal. Mediastinal contours appear intact. There is no evidence of pleural effusion or pneumothorax. Improved aeration the lungs. Minimal left lower lobe peribronchial atelectasis versus airspace consolidation. Osseous structures are without acute abnormality. Soft tissues are grossly normal. IMPRESSION: Minimal left lower lobe peribronchial atelectasis versus airspace consolidation. Electronically Signed   By: Ted Mcalpine M.D.   On: 04/15/2018 08:29  Dg  Chest Port 1 View  Result Date: 04/14/2018 CLINICAL DATA:  47 year old male with sepsis. EXAM: PORTABLE CHEST 1 VIEW COMPARISON:  None. FINDINGS: The heart size and mediastinal contours are within normal limits. Both lungs are clear. The visualized skeletal structures are unremarkable. IMPRESSION: No active disease. Electronically Signed   By: Elgie Collard M.D.   On: 04/14/2018 05:09    Micro Results      Recent Results (from the past 240 hour(s))  Blood Culture (routine x 2)     Status: None (Preliminary result)   Collection Time: 04/14/18  4:24 AM  Result Value Ref Range Status   Specimen Description BLOOD RIGHT FOREARM  Final   Special Requests   Final    BOTTLES DRAWN AEROBIC AND ANAEROBIC Blood Culture adequate volume   Culture   Final    NO GROWTH 4 DAYS Performed at [facility], [address] [place], [place] [address]    Report Status PENDING  Incomplete  Blood Culture (routine x 2)     Status: None (Preliminary result)   Collection Time: 04/14/18  4:24 AM  Result Value Ref Range Status   Specimen Description BLOOD RIGHT ANTECUBITAL  Final   Special Requests   Final    BOTTLES DRAWN AEROBIC AND ANAEROBIC Blood Culture adequate volume   Culture   Final    NO GROWTH 4 DAYS Performed at [facility], [address] [place], [place] [address]    Report Status PENDING  Incomplete  Respiratory Panel by PCR     Status: None   Collection Time: 04/14/18  6:16 AM  Result Value Ref Range Status   Adenovirus NOT DETECTED NOT DETECTED Final   Coronavirus 229E NOT DETECTED NOT DETECTED Final   Coronavirus HKU1 NOT DETECTED NOT DETECTED Final   Coronavirus NL63 NOT DETECTED NOT DETECTED Final   Coronavirus OC43 NOT DETECTED NOT DETECTED Final   Metapneumovirus NOT DETECTED NOT DETECTED Final   Rhinovirus / Enterovirus NOT DETECTED NOT DETECTED Final   Influenza A NOT DETECTED NOT DETECTED Final   Influenza B NOT DETECTED NOT DETECTED Final    Parainfluenza Virus 1 NOT DETECTED NOT DETECTED Final   Parainfluenza Virus 2 NOT DETECTED NOT DETECTED Final   Parainfluenza Virus 3 NOT DETECTED NOT DETECTED Final   Parainfluenza Virus 4 NOT DETECTED NOT DETECTED Final   Respiratory Syncytial Virus NOT DETECTED NOT DETECTED Final   Bordetella pertussis NOT DETECTED NOT DETECTED Final   Chlamydophila pneumoniae NOT DETECTED NOT DETECTED Final   Mycoplasma pneumoniae NOT DETECTED NOT DETECTED Final    Comment: Performed at [facility], [address], [place], [place] [address]  Group A Strep by PCR [facility] Only)     Status: Abnormal   Collection Time: 04/14/18  6:16 AM  Result Value Ref Range Status   Group A Strep by PCR DETECTED (A) NOT DETECTED Final    Comment: Performed at [facility], [address] [place], [place] [address]  MRSA PCR Screening     Status: None   Collection Time: 04/14/18 10:33 AM  Result Value Ref Range Status   MRSA by PCR NEGATIVE NEGATIVE Final    Comment:        The GeneXpert MRSA Assay (FDA approved for NASAL specimens only), is one component of a comprehensive MRSA colonization surveillance program. It is not intended to diagnose MRSA infection nor to guide or monitor treatment for MRSA infections. Performed at [facility], [phone]  [address] [place], [place] [address]        Today   Subjective:   Thaddius Manes today decreased pain and swelling in the chin wants to go home.  Objective:   Blood pressure 111/73, pulse 61, temperature 98.4 F (36.9 C), temperature source Oral, resp. rate 20, height 5\' 11"  (1.803 m), weight 83.3 kg, SpO2 94 %.  No intake or output data in the 24 hours ending 04/18/18 1534  Exam Awake Alert, Oriented x 3, No new F.N deficits, Normal affect [place].AT,PERRAL, patient has swollen but soft swelling under lower jaw Supple Neck,No JVD, No cervical lymphadenopathy appriciated.  Symmetrical Chest wall movement, Good air movement  bilaterally, CTAB RRR,No Gallops,Rubs or new Murmurs, No Parasternal Heave +ve B.Sounds, Abd Soft, Non tender, No organomegaly appriciated, No rebound -guarding or rigidity. No Cyanosis, Clubbing or edema, No new Rash or bruise  Data Review   CBC w Diff:  Lab Results  Component Value Date   WBC 14.9 (H) 04/18/2018   HGB 13.0 04/18/2018   HCT 40.0 04/18/2018   PLT 297 04/18/2018   LYMPHOPCT 13 04/17/2018   MONOPCT 8 04/17/2018   EOSPCT 0 04/17/2018   BASOPCT 0 04/17/2018    CMP:  Lab Results  Component Value Date   NA 141 04/18/2018   K 4.2 04/18/2018   CL 107 04/18/2018   CO2 28 04/18/2018   BUN 15 04/18/2018   CREATININE 0.70 04/18/2018   PROT 6.5 04/17/2018   ALBUMIN 3.0 (L) 04/17/2018   BILITOT 0.4 04/17/2018   ALKPHOS 61 04/17/2018   AST 20 04/17/2018   ALT 30 04/17/2018  .   Total Time in preparing paper work, data evaluation and todays exam - 35 minutes  Katha Hamming M.D on 04/16/2018 at 3:34 PM    Note: This dictation was prepared with Dragon dictation along with smaller phrase technology. Any transcriptional errors that result from this process are unintentional.

## 2018-04-18 NOTE — Consult Note (Signed)
NAME: Jeffrey Gillespie  DOB: 1970-08-23  MRN: [id number]  Date/Time: 04/18/2018 2:11 PM  Jeffrey Gillespie REASON FOR CONSULT: neck abscess ? Jeffrey Gillespie is a 47 y.o. male with no past medical history is admitted with painful swelling of neck Pt says he has been in good health until a few weeks ago. He was in detention for nearly 2 months and while in jail he developed tooth ache on the left side. Once released he wanted to go to the dentist but as he had to work to get money he took advil. He was out of prison for nearly 10-12 days when on Friday ( 11/1) he developed swelling of the neck with  pain and chills and fever. Swallowing was very painful.  A day before he had seen his son and his neck was fine   He started having fever and chills and the whole neck below his chin was swollen and he came to the ED on 11/2 at 2 in the morning. He had CT of the neck and it showed Acute LEFT greater than RIGHT submandibular sialoadenitis. 2. Transspatial edema sparing floor of mouth. No abscess. Patent airway. He was started on unasyn . He was seen by ENT and underwent Trans-nasal flexible laryngoscopy. This demonstrated bilateral tonsillar edema and inflammation L >R, and lateral pharyngeal wall swelling on left and involvement of left piriform sinus and left arytenoid with boggy edema.  The arytenoid is prolapsing into the patient's airway.  Vocal folds easily visualized and patent. He was started on steroids and throat and blood cultures were sent .He was in ICU. The edema got better and another flexible laryngoscopy around noon the same day showed Improved left arytenoid edema with resolution of bogginess and prolapse into airway, continued but improved left piriform sinus edema and erythema.  Widely patent airway. Group A strep was positive from the throat. Mumps was questioned and IGM ws sent  He was discharged on 04/16/18 with PO augmentin and steroids- He came back to the ED the next day with pain and  difficulty swallowing. He also had worsening swelling     He did not have any fever. He had not taken any antibiotics since discharge CT of the neck was repeated and it showed Inflammation within the bilateral submandibular compartments and diffusely throughout the soft tissues of the anterior neck are markedly increased. Additionally, there was a new fluid collection within the right strap muscles measuring up to 4.6 cm (11 cc), probably a phlegmon/developing abscess. He was restarted on IV unasyn and steroids-  He says he is feeling better today Seen by ENT who asked for ID consult No Tick or insect bites Did not eat any uncooked food   PMH None  PSH None  SH Non smoker No alcohol Has 2 children Lives with his mother Works in [employer] Originally from [place] No travel recently Has a cat and dog which stays outside- no cat scratches or bites  Family History  Problem Relation Age of Onset  . Diabetes Mellitus II Neg Hx   . CAD Neg Hx    No Known Allergies  ? Current Facility-Administered Medications  Medication Dose Route Frequency Provider Last Rate Last Dose  . 0.9 %  sodium chloride infusion  250 mL Intravenous PRN Shaune Pollack, MD      . acetaminophen (TYLENOL) tablet 650 mg  650 mg Oral Q6H PRN Shaune Pollack, MD       Or  . acetaminophen (TYLENOL) suppository 650 mg  650  mg Rectal Q6H PRN Shaune Pollack, MD      . albuterol (PROVENTIL) (2.5 MG/3ML) 0.083% nebulizer solution 2.5 mg  2.5 mg Nebulization Q2H PRN Shaune Pollack, MD      . Ampicillin-Sulbactam (UNASYN) 3 g in sodium chloride 0.9 % 100 mL IVPB  3 g Intravenous Q8H Shaune Pollack, MD 200 mL/hr at 04/18/18 1128 3 g at 04/18/18 1128  . bisacodyl (DULCOLAX) EC tablet 5 mg  5 mg Oral Daily PRN Shaune Pollack, MD      . dexamethasone (DECADRON) injection 4 mg  4 mg Intravenous Samson Frederic, MD   4 mg at 04/18/18 1113  . enoxaparin (LOVENOX) injection 40 mg  40 mg Subcutaneous Q24H Shaune Pollack, MD   40 mg at 04/17/18 2043    . HYDROcodone-acetaminophen (NORCO/VICODIN) 5-325 MG per tablet 1-2 tablet  1-2 tablet Oral Q4H PRN Shaune Pollack, MD   1 tablet at 04/17/18 2044  . ondansetron (ZOFRAN) tablet 4 mg  4 mg Oral Q6H PRN Shaune Pollack, MD       Or  . ondansetron [facility]) injection 4 mg  4 mg Intravenous Q6H PRN Shaune Pollack, MD      . senna-docusate (Senokot-S) tablet 1 tablet  1 tablet Oral QHS PRN Shaune Pollack, MD      . sodium chloride flush (NS) 0.9 % injection 3 mL  3 mL Intravenous Q12H Shaune Pollack, MD   3 mL at 04/18/18 1113  . sodium chloride flush (NS) 0.9 % injection 3 mL  3 mL Intravenous PRN Shaune Pollack, MD         Abtx:  Anti-infectives (From admission, onward)   Start     Dose/Rate Route Frequency Ordered Stop   04/18/18 0300  Ampicillin-Sulbactam (UNASYN) 3 g in sodium chloride 0.9 % 100 mL IVPB     3 g 200 mL/hr over 30 Minutes Intravenous Every 8 hours 04/17/18 2327     04/17/18 1930  Ampicillin-Sulbactam (UNASYN) 3 g in sodium chloride 0.9 % 100 mL IVPB     3 g 200 mL/hr over 30 Minutes Intravenous  Once 04/17/18 1854 04/17/18 2009      REVIEW OF SYSTEMS:  Const: fever,  chills, negative weight loss Eyes: negative diplopia or visual changes, negative eye pain ENT: negative coryza, negative sore throat, odynophagia,  Resp: negative cough, hemoptysis, dyspnea Cards: negative for chest pain, palpitations, lower extremity edema GU: negative for frequency, dysuria and hematuria GI: Negative for abdominal pain, diarrhea, bleeding, constipation Skin: negative for rash and pruritus Heme: negative for easy bruising and gum/nose bleeding MS: negative for myalgias, arthralgias, back pain and muscle weakness Neurolo:negative for headaches, dizziness, vertigo, memory problems  Psych: negative for feelings of anxiety, depression  Endocrine: negative for thyroid, diabetes Allergy/Immunology- negative for any medication or food allergies ? Objective:  VITALS:  BP 108/72 (BP Location: Left Arm)   Pulse 64    Temp 98.3 F (36.8 C) (Oral)   Resp 18   Ht 5\' 11"  (1.803 m)   Wt 83.2 kg   SpO2 97%   BMI 25.58 kg/m  PHYSICAL EXAM:  General: Alert, cooperative, no distress, appears stated age.  Head: Normocephalic, without obvious abnormality, atraumatic. Eyes: Conjunctivae clear, anicteric sclerae. Pupils are equal ENT Nares normal. No drainage or sinus tenderness. Oral cavity- rt tonsil slightly enlarged- no erythema Dental hygiene poor Neck: Supple, Swelling below the chin- 3 cm nodular area on the left side    No drooling of saliva Back: No CVA tenderness.  Lungs: Clear to auscultation bilaterally. No Wheezing or Rhonchi. No rales. Heart: Regular rate and rhythm, no murmur, rub or gallop. Abdomen: Soft, non-tender,not distended. Bowel sounds normal. No masses Extremities: atraumatic, no cyanosis. No edema. No clubbing Skin: No rashes or lesions. Or bruising Lymph: Cervical, supraclavicular normal. Neurologic: Grossly non-focal Pertinent Labs Lab Results CBC CBC Latest Ref Rng & Units 04/18/2018 04/17/2018 04/16/2018  WBC 4.0 - 10.5 K/uL 14.9(H) 17.4(H) 20.7(H)  Hemoglobin 13.0 - 17.0 g/dL 95.6 21.3 12.6(L)  Hematocrit 39.0 - 52.0 % 40.0 39.0 37.4(L)  Platelets 150 - 400 K/uL 297 287 244      Component Value Date/Time   WBC 14.9 (H) 04/18/2018 0310   RBC 4.59 04/18/2018 0310   HGB 13.0 04/18/2018 0310   HCT 40.0 04/18/2018 0310   PLT 297 04/18/2018 0310   MCV 87.1 04/18/2018 0310   MCH 28.3 04/18/2018 0310   MCHC 32.5 04/18/2018 0310   RDW 13.3 04/18/2018 0310   LYMPHSABS 2.2 04/17/2018 1845   MONOABS 1.5 (H) 04/17/2018 1845   EOSABS 0.0 04/17/2018 1845   BASOSABS 0.1 04/17/2018 1845    CMP Latest Ref Rng & Units 04/18/2018 04/17/2018 04/15/2018  Glucose 70 - 99 mg/dL 086(V) 784(O) 962(X)  BUN 6 - 20 mg/dL 15 19 13   Creatinine 0.61 - 1.24 mg/dL 5.28 4.13 2.44  Sodium 135 - 145 mmol/L 141 137 139  Potassium 3.5 - 5.1 mmol/L 4.2 3.4(L) 4.0  Chloride 98 - 111 mmol/L 107  105 109  CO2 22 - 32 mmol/L 28 24 22   Calcium 8.9 - 10.3 mg/dL 0.1(U) 2.7(O) 5.3(G)  Total Protein 6.5 - 8.1 g/dL - 6.5 -  Total Bilirubin 0.3 - 1.2 mg/dL - 0.4 -  Alkaline Phos 38 - 126 U/L - 61 -  AST 15 - 41 U/L - 20 -  ALT 0 - 44 U/L - 30 -   HIV neg Mumps IgM neg IgG positive   Microbiology: Recent Results (from the past 240 hour(s))  Blood Culture (routine x 2)     Status: None (Preliminary result)   Collection Time: 04/14/18  4:24 AM  Result Value Ref Range Status   Specimen Description BLOOD RIGHT FOREARM  Final   Special Requests   Final    BOTTLES DRAWN AEROBIC AND ANAEROBIC Blood Culture adequate volume   Culture   Final    NO GROWTH 4 DAYS Performed at [facility], [address] [place], [place] [address]    Report Status PENDING  Incomplete  Blood Culture (routine x 2)     Status: None (Preliminary result)   Collection Time: 04/14/18  4:24 AM  Result Value Ref Range Status   Specimen Description BLOOD RIGHT ANTECUBITAL  Final   Special Requests   Final    BOTTLES DRAWN AEROBIC AND ANAEROBIC Blood Culture adequate volume   Culture   Final    NO GROWTH 4 DAYS Performed at [facility], [address] [place], [place] [address]    Report Status PENDING  Incomplete  Respiratory Panel by PCR     Status: None   Collection Time: 04/14/18  6:16 AM  Result Value Ref Range Status   Adenovirus NOT DETECTED NOT DETECTED Final   Coronavirus 229E NOT DETECTED NOT DETECTED Final   Coronavirus HKU1 NOT DETECTED NOT DETECTED Final   Coronavirus NL63 NOT DETECTED NOT DETECTED Final   Coronavirus OC43 NOT DETECTED NOT DETECTED Final   Metapneumovirus NOT DETECTED NOT DETECTED Final   Rhinovirus /  Enterovirus NOT DETECTED NOT DETECTED Final   Influenza A NOT DETECTED NOT DETECTED Final   Influenza B NOT DETECTED NOT DETECTED Final   Parainfluenza Virus 1 NOT DETECTED NOT DETECTED Final   Parainfluenza Virus 2 NOT DETECTED NOT DETECTED Final    Parainfluenza Virus 3 NOT DETECTED NOT DETECTED Final   Parainfluenza Virus 4 NOT DETECTED NOT DETECTED Final   Respiratory Syncytial Virus NOT DETECTED NOT DETECTED Final   Bordetella pertussis NOT DETECTED NOT DETECTED Final   Chlamydophila pneumoniae NOT DETECTED NOT DETECTED Final   Mycoplasma pneumoniae NOT DETECTED NOT DETECTED Final    Comment: Performed at [facility], [address], [place], [place] [address]  Group A Strep by PCR [facility] Only)     Status: Abnormal   Collection Time: 04/14/18  6:16 AM  Result Value Ref Range Status   Group A Strep by PCR DETECTED (A) NOT DETECTED Final    Comment: Performed at [facility], [address] [place], [place] [address]  MRSA PCR Screening     Status: None   Collection Time: 04/14/18 10:33 AM  Result Value Ref Range Status   MRSA by PCR NEGATIVE NEGATIVE Final    Comment:        The GeneXpert MRSA Assay (FDA approved for NASAL specimens only), is one component of a comprehensive MRSA colonization surveillance program. It is not intended to diagnose MRSA infection nor to guide or monitor treatment for MRSA infections. Performed at [facility], [address] [place], [place] [address]    IMAGING RESULTS: ?   Impression/Recommendation 47 y.o. male with no past medical history is admitted with painful swelling of neck.47 y.o. male with no past medical history is admitted with painful swelling of neck Pt says he has been in good health until a few weeks ago. He was in detention for nearly 2 months and while in jail he developed tooth ache on the left side. Once released he wanted to go to the dentist but as he had to work to get money he took advil. He was out of prison for nearly 10-12 days when on Friday ( 11/1) he developed swelling of the neck with  pain and chills and fever. Swallowing was very painful. ? ? Infectious swelling of the neck- Inciting factor was tooth pain and it progressed like  ludwigs angina and now he has a an abscess/localized fluid collection on the left submandibular area. Had Group A strep positive in the throat- no peritonsillar abscess or thrombophlebitis of the IJ. Is the fluid collection an infected LN VS submandibular gland?? Will need surgical drainage as th fluid collection may not resolve with antibiotics Continue Unasyn( change to q6) This is not mumps ( has positive IgG, neg IgM , vaccinated ? ___________________________________________________ Discussed with patient and his mother

## 2018-04-18 NOTE — Care Management (Signed)
Per RNCM note on 11/4 at previous discharge "[facility]. [facility] for [facility] up. "  RNCM following for needs

## 2018-04-18 NOTE — Progress Notes (Signed)
.. 04/18/2018 12:16 PM  Jeffrey Gillespie [id number]  Hospital Day 2    Temp:  [97.5 F (36.4 C)-98.3 F (36.8 C)] 98.3 F (36.8 C) (11/06 1150) Pulse Rate:  [48-92] 64 (11/06 1150) Resp:  [16-18] 18 (11/06 1150) BP: (105-113)/(60-72) 108/72 (11/06 1150) SpO2:  [92 %-100 %] 97 % (11/06 1150) Weight:  [83.2 kg-83.3 kg] 83.2 kg (11/05 2026),     Intake/Output Summary (Last 24 hours) at 04/18/2018 1216 Last data filed at 04/18/2018 0600 Gross per 24 hour  Intake 1022 ml  Output 725 ml  Net 297 ml    Results for orders placed or performed during the hospital encounter of 04/17/18 (from the past 24 hour(s))  CBC with Differential     Status: Abnormal   Collection Time: 04/17/18  6:45 PM  Result Value Ref Range   WBC 17.4 (H) 4.0 - 10.5 K/uL   RBC 4.60 4.22 - 5.81 MIL/uL   Hemoglobin 13.4 13.0 - 17.0 g/dL   HCT 82.9 56.2 - 13.0 %   MCV 84.8 80.0 - 100.0 fL   MCH 29.1 26.0 - 34.0 pg   MCHC 34.4 30.0 - 36.0 g/dL   RDW 86.5 78.4 - 69.6 %   Platelets 287 150 - 400 K/uL   nRBC 0.0 0.0 - 0.2 %   Neutrophils Relative % 77 %   Neutro Abs 13.4 (H) 1.7 - 7.7 K/uL   Lymphocytes Relative 13 %   Lymphs Abs 2.2 0.7 - 4.0 K/uL   Monocytes Relative 8 %   Monocytes Absolute 1.5 (H) 0.1 - 1.0 K/uL   Eosinophils Relative 0 %   Eosinophils Absolute 0.0 0.0 - 0.5 K/uL   Basophils Relative 0 %   Basophils Absolute 0.1 0.0 - 0.1 K/uL   Immature Granulocytes 2 %   Abs Immature Granulocytes 0.26 (H) 0.00 - 0.07 K/uL  Comprehensive metabolic panel     Status: Abnormal   Collection Time: 04/17/18  6:45 PM  Result Value Ref Range   Sodium 137 135 - 145 mmol/L   Potassium 3.4 (L) 3.5 - 5.1 mmol/L   Chloride 105 98 - 111 mmol/L   CO2 24 22 - 32 mmol/L   Glucose, Bld 119 (H) 70 - 99 mg/dL   BUN 19 6 - 20 mg/dL   Creatinine, Ser 2.95 0.61 - 1.24 mg/dL   Calcium 8.2 (L) 8.9 - 10.3 mg/dL   Total Protein 6.5 6.5 - 8.1 g/dL   Albumin 3.0 (L) 3.5 - 5.0 g/dL   AST 20 15 - 41 U/L   ALT 30 0 - 44  U/L   Alkaline Phosphatase 61 38 - 126 U/L   Total Bilirubin 0.4 0.3 - 1.2 mg/dL   GFR calc non Af Amer >60 >60 mL/min   GFR calc Af Amer >60 >60 mL/min   Anion gap 8 5 - 15  Basic metabolic panel     Status: Abnormal   Collection Time: 04/18/18  3:10 AM  Result Value Ref Range   Sodium 141 135 - 145 mmol/L   Potassium 4.2 3.5 - 5.1 mmol/L   Chloride 107 98 - 111 mmol/L   CO2 28 22 - 32 mmol/L   Glucose, Bld 159 (H) 70 - 99 mg/dL   BUN 15 6 - 20 mg/dL   Creatinine, Ser 2.84 0.61 - 1.24 mg/dL   Calcium 8.7 (L) 8.9 - 10.3 mg/dL   GFR calc non Af Amer >60 >60 mL/min   GFR calc Af Amer >60 >60  mL/min   Anion gap 6 5 - 15  CBC     Status: Abnormal   Collection Time: 04/18/18  3:10 AM  Result Value Ref Range   WBC 14.9 (H) 4.0 - 10.5 K/uL   RBC 4.59 4.22 - 5.81 MIL/uL   Hemoglobin 13.0 13.0 - 17.0 g/dL   HCT 91.4 78.2 - 95.6 %   MCV 87.1 80.0 - 100.0 fL   MCH 28.3 26.0 - 34.0 pg   MCHC 32.5 30.0 - 36.0 g/dL   RDW 21.3 08.6 - 57.8 %   Platelets 297 150 - 400 K/uL   nRBC 0.0 0.0 - 0.2 %    SUBJECTIVE:  No acute events overnight.  Admitted for IV abx after discharge home.  Patient reports did not get oral antibiotics for 6 hours after discharge and noticed a significant increase of swelling.  Reports that swelling progressed and caused odynophagia yesterday and came to ER and was admitted.  CT performed that showed improvement in airway swelling but increase in anterior neck swelling with right strap muscle possible developing phlegmon.  Patient reports significant improvement overnight in right neck swelling and points to left neck in area of pain.  OBJECTIVE:  GEN-  NAD, alert and oriented sitting upright in bed NEURO-  CN II-XII intact OC/OP-  No floor of mouth edema, no purulence from submandibular glands, clear saliva NECK-  Pronounced anterior neck swelling just superior to thyroid, tenderness on left.  Firm.  No fluctuance seen  IMPRESSION:  Anterior neck cellulitis with  possibly developing abscess  PLAN:  Odd picture give significant improvement in right neck swelling overnight as patient has photos on phone on what he looked like on admission versus now.  Would not expect improvement that quickly for abscess.  Still unclear of what primary etiology is as per CT scan seemed to originate in salivary glands yet he has never had purulent drainage from glands.  Patient initially had left dental pain prior to onset of swelling.  IgM for Mumps is negative.  Patient reports he is unsure of immunizations.  Given improvement in last 12 hours, recommend continuation of Unasyn and Decadron.  Strep A positive but no behaving like normal Strep infection.  Recommend ID consultation.  Hold on I&D at this time given no palpable fluctuance and significant improvement.  Could consider IR drainage via ultrasound if persists.  Jeffrey Gillespie 04/18/2018, 12:16 PM

## 2018-04-19 ENCOUNTER — Inpatient Hospital Stay: Payer: Self-pay

## 2018-04-19 LAB — CULTURE, BLOOD (ROUTINE X 2)
CULTURE: NO GROWTH
Culture: NO GROWTH
SPECIAL REQUESTS: ADEQUATE
Special Requests: ADEQUATE

## 2018-04-19 NOTE — Progress Notes (Signed)
[place] result reviewed.  No abscess, just edema as CT showed.  Patient cleared for PO intake.

## 2018-04-19 NOTE — Progress Notes (Signed)
[facility] [place] [facility]   PATIENT NAME: Jeffrey Gillespie    MR#:  [id number]  DATE OF BIRTH:  1971/05/21  SUBJECTIVE:   Patient came in with increasing swelling in the left part of his neck along with pain. He was just discharge couple days ago with prednisone and Augmentin. No fever. Patient able to swallow soft diet. No shortness of breath. REVIEW OF SYSTEMS:   Review of Systems  Constitutional: Negative for chills, fever and weight loss.  HENT: Positive for sore throat. Negative for ear discharge, ear pain and nosebleeds.        Swelling over the neck left more than right  Eyes: Negative for blurred vision, pain and discharge.  Respiratory: Negative for sputum production, shortness of breath, wheezing and stridor.   Cardiovascular: Negative for chest pain, palpitations, orthopnea and PND.  Gastrointestinal: Negative for abdominal pain, diarrhea, nausea and vomiting.  Genitourinary: Negative for frequency and urgency.  Musculoskeletal: Negative for back pain and joint pain.  Neurological: Negative for sensory change, speech change, focal weakness and weakness.  Psychiatric/Behavioral: Negative for depression and hallucinations. The patient is not nervous/anxious.    Tolerating Diet: yes Tolerating PT: not needed  DRUG ALLERGIES:  No Known Allergies  VITALS:  Blood pressure 113/73, pulse (!) 53, temperature 98.3 F (36.8 C), temperature source Oral, resp. rate 17, height 5\' 11"  (1.803 m), weight 83.2 kg, SpO2 95 %.  PHYSICAL EXAMINATION:   Physical Exam  GENERAL:  47 y.o.-year-old patient lying in the bed with no acute distress.  EYES: Pupils equal, round, reactive to light and accommodation. No scleral icterus. Extraocular muscles intact.  HEENT: Head atraumatic, normocephalic. Oropharynx and nasopharynx clear.  NECK:  Supple, no jugular venous distention. No thyroid enlargement, no tenderness. Left neck fullness with swelling and mild  tenderness. LUNGS: Normal breath sounds bilaterally, no wheezing, rales, rhonchi. No use of accessory muscles of respiration.  CARDIOVASCULAR: S1, S2 normal. No murmurs, rubs, or gallops.  ABDOMEN: Soft, nontender, nondistended. Bowel sounds present. No organomegaly or mass.  EXTREMITIES: No cyanosis, clubbing or edema b/l.    NEUROLOGIC: Cranial nerves II through XII are intact. No focal Motor or sensory deficits b/l.   PSYCHIATRIC:  patient is alert and oriented x 3.  SKIN: No obvious rash, lesion, or ulcer.   LABORATORY PANEL:  CBC Recent Labs  Lab 04/18/18 0310  WBC 14.9*  HGB 13.0  HCT 40.0  PLT 297    Chemistries  Recent Labs  Lab 04/15/18 0418 04/17/18 1845 04/18/18 0310  NA 139 137 141  K 4.0 3.4* 4.2  CL 109 105 107  CO2 22 24 28   GLUCOSE 140* 119* 159*  BUN 13 19 15   CREATININE 0.73 0.69 0.70  CALCIUM 8.8* 8.2* 8.7*  MG 2.0  --   --   AST  --  20  --   ALT  --  30  --   ALKPHOS  --  61  --   BILITOT  --  0.4  --    Cardiac Enzymes Recent Labs  Lab 04/14/18 0424  TROPONINI <0.03   RADIOLOGY:  Ct Soft Tissue Neck W Contrast  Result Date: 04/17/2018 CLINICAL DATA:  47 y/o  M; neck swelling and tenderness for 4 days. EXAM: CT NECK WITH CONTRAST TECHNIQUE: Multidetector CT imaging of the neck was performed using the standard protocol following the bolus administration of intravenous contrast. CONTRAST:  75mL OMNIPAQUE IOHEXOL 300 MG/ML  SOLN COMPARISON:  04/14/2018 CT  of the neck. FINDINGS: Pharynx and larynx: Decreased mucosal thickening of the oropharynx. No exophytic mass. Salivary glands: Persistent mild enhancement of the bilateral submandibular glands. No abnormality of parotid glands. Thyroid: Normal. Lymph nodes: Bilateral anterior cervical lymphadenopathy, likely reactive. No lymph node necrosis. Vascular: Negative. Limited intracranial: Negative. Visualized orbits: Negative. Mastoids and visualized paranasal sinuses: Bilateral maxillary sinus mucous  retention cyst. Normal aeration of the mastoid air cells. Orbits are unremarkable. Skeleton: No acute or aggressive process. Upper chest: Negative. Other: Inflammatory changes within the bilateral submandibular compartments, anterior cervical triangles, and the superficial soft tissues of the anterior neck is markedly increased from the prior CT. Additionally, there is a developing fluid collection within the right strap muscles measuring 1.9 x 2.4 x 4.6 cm (volume = 11 cm^3) (AP x ML x CC series 7, image 73 and series 2, image 77). IMPRESSION: 1. Inflammation within the bilateral submandibular compartments and diffusely throughout the soft tissues of the anterior neck are markedly increased. Additionally, there is a new fluid collection within the right strap muscles measuring up to 4.6 cm (11 cc), probably a phlegmon/developing abscess. 2. Reactive bilateral anterior cervical lymphadenopathy. Electronically Signed   By: Mitzi Hansen M.D.   On: 04/17/2018 19:58   ASSESSMENT AND PLAN:  Jeffrey Gillespie  is a 47 y.o. male with no past medical history.  He was admitted for Group A strep pharyngitis vs odontogenic cellulitis 2 days ago, treated with Unasyn and Decadron.  He was discharged with Augmentin and prednisone yesterday.  He complains of facial and neck pain and swelling.    *Acute  Pharyngitis\with leukocytosis -Continue Unasyn and Decadron IV per ENT physician.   -CT scan shows worsening collection of fluid on the right strap muscle measuring [phone] cm -pt  has some swelling on the left neck as well. -Overall he feels better -ENT discussed with IR -- attempt ultrasound-guided aspiration -ID consultation noted. Recommends drainage of fluid. Continue unasyn -patient hemodynamically stable. Able to swallow. No respiratory distress.  *Leukocytosis  *Hypokalemia.  Potassium supplement. -Corrected   Case discussed with Care Management/Social Worker. Management plans discussed  with the patient, family and they are in agreement.  CODE STATUS: FULL  DVT Prophylaxis: lovneox  TOTAL TIME TAKING CARE OF THIS PATIENT: *40* minutes.  >50% time spent on counselling and coordination of care  POSSIBLE D/C IN *1-2* DAYS, DEPENDING ON CLINICAL CONDITION.  Note: This dictation was prepared with Dragon dictation along with smaller phrase technology. Any transcriptional errors that result from this process are unintentional.  Enedina Finner M.D on 04/19/2018 at 8:09 AM  Between 7am to 6pm - Pager - [phone]  After 6pm go to [email or web address] - password [email or web address]  [facility]  Office  [phone]  CC: Primary care physician; Patient, No Pcp PerPatient ID: Jeffrey Gillespie, male   DOB: 07-07-1970, 47 y.o.   MRN: [id number]

## 2018-04-19 NOTE — Discharge Summary (Signed)
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 No pertinent past medical history.  HOSPITAL COURSE:   RicardoMoralesis a47 y.o.malewith no past medical history. He was admitted for Group A strep pharyngitis vs odontogenic cellulitis2 days ago, treated with Unasyn and Decadron. He was discharged with Augmentin and prednisone yesterday.He complains of facial and neck pain and swelling.   *Acute  neck cellulitis with leukocytosis -Continue Unasyn and Decadron IV-- change to oral Augmentin and steroid taper pack. Patient already has it. He will finish the course. -CT scan shows worsening collection of fluid on the right strap muscle measuring [phone] cm -pt  has some swelling on the left neck as well. -Overall he feels better -ENT discussed with IR -- attempted ultrasound-guided aspiration-- and has cervical tissue edema. No  collection of fluid noted on ultrasound. -ID consultation noted  -patient hemodynamically stable. Able to swallow. No respiratory distress. -IgM for mumps negative -patient will follow-up with ENT in one week.  *Leukocytosis---improving  *Hypokalemia. Repleted -Corrected  will discharged to home with outpatient ENT follow-up. He is recommended to return to urgent care and or ER or called ENT if sign symptoms worsen. CONSULTS OBTAINED:  Treatment Team:  Bud Face, MD Lynn Ito, MD  DRUG ALLERGIES:  No Known Allergies  DISCHARGE MEDICATIONS:   Allergies as of 04/19/2018   No Known Allergies     Medication List    TAKE these medications    amoxicillin-clavulanate 875-125 MG tablet Commonly known as:  AUGMENTIN Take 1 tablet by mouth 2 (two) times daily for 7 days.   predniSONE 10 MG (21) Tbpk tablet Commonly known as:  STERAPRED UNI-PAK 21 TAB Taper by 10 mg daily What changed:    how much to take  how to take this  when to take this  additional instructions       If you experience worsening of your admission symptoms, develop shortness of breath, life threatening emergency, suicidal or homicidal thoughts you must seek medical attention immediately by calling 911 or calling your MD immediately  if symptoms less severe.  You Must read complete instructions/literature along with all the possible adverse reactions/side effects for all the Medicines you take and that have been prescribed to you. Take any new Medicines after you have completely understood and accept all the possible adverse reactions/side effects.   Please note  You were cared for by a hospitalist during your hospital stay. If you have any questions about your discharge medications or the care you received while you were in the hospital after you are discharged, you can call the unit and asked to speak with the hospitalist on call if the hospitalist that took care of you is not available. Once you are discharged, your primary care physician will handle any further medical issues. Please note that NO REFILLS for any discharge medications will be authorized once you are discharged, as it is imperative that you return to your primary care physician (or establish a relationship with a primary care physician if you do not have one) for your aftercare needs so that they can reassess your need for medications and monitor  your lab values. Today   SUBJECTIVE    All feels better. No fever. VITAL SIGNS:  Blood pressure 110/79, pulse (!) 55, temperature 98.4 F (36.9 C), temperature source Oral, resp. rate 17, height 5\' 11"  (1.803 m), weight 83.2 kg, SpO2 96  %.  I/O:    Intake/Output Summary (Last 24 hours) at 04/19/2018 1339 Last data filed at 04/19/2018 1039 Gross per 24 hour  Intake 1114.06 ml  Output 2000 ml  Net -885.94 ml    PHYSICAL EXAMINATION:  GENERAL:  47 y.o.-year-old patient lying in the bed with no acute distress.  EYES: Pupils equal, round, reactive to light and accommodation. No scleral icterus. Extraocular muscles intact.  HEENT: Head atraumatic, normocephalic. Oropharynx and nasopharynx clear.  NECK:  Supple, no jugular venous distention. No thyroid enlargement, no tenderness. Anterior neck swelling. Mild tenderness. LUNGS: Normal breath sounds bilaterally, no wheezing, rales,rhonchi or crepitation. No use of accessory muscles of respiration.  CARDIOVASCULAR: S1, S2 normal. No murmurs, rubs, or gallops.  ABDOMEN: Soft, non-tender, non-distended. Bowel sounds present. No organomegaly or mass.  EXTREMITIES: No pedal edema, cyanosis, or clubbing.  NEUROLOGIC: Cranial nerves II through XII are intact. Muscle strength 5/5 in all extremities. Sensation intact. Gait not checked.  PSYCHIATRIC: The patient is alert and oriented x 3.  SKIN: No obvious rash, lesion, or ulcer.   DATA REVIEW:   CBC  Recent Labs  Lab 04/18/18 0310  WBC 14.9*  HGB 13.0  HCT 40.0  PLT 297    Chemistries  Recent Labs  Lab 04/15/18 0418 04/17/18 1845 04/18/18 0310  NA 139 137 141  K 4.0 3.4* 4.2  CL 109 105 107  CO2 22 24 28   GLUCOSE 140* 119* 159*  BUN 13 19 15   CREATININE 0.73 0.69 0.70  CALCIUM 8.8* 8.2* 8.7*  MG 2.0  --   --   AST  --  20  --   ALT  --  30  --   ALKPHOS  --  61  --   BILITOT  --  0.4  --     Microbiology Results   Recent Results (from the past 240 hour(s))  Blood Culture (routine x 2)     Status: None   Collection Time: 04/14/18  4:24 AM  Result Value Ref Range Status   Specimen Description BLOOD RIGHT FOREARM  Final   Special Requests   Final    BOTTLES DRAWN AEROBIC AND ANAEROBIC Blood Culture  adequate volume   Culture   Final    NO GROWTH 5 DAYS Performed at [facility], [address], [place], [place] [address]    Report Status 04/19/2018 FINAL  Final  Blood Culture (routine x 2)     Status: None   Collection Time: 04/14/18  4:24 AM  Result Value Ref Range Status   Specimen Description BLOOD RIGHT ANTECUBITAL  Final   Special Requests   Final    BOTTLES DRAWN AEROBIC AND ANAEROBIC Blood Culture adequate volume   Culture   Final    NO GROWTH 5 DAYS Performed at [facility], [address], [place], [place] [address]    Report Status 04/19/2018 FINAL  Final  Respiratory Panel by PCR     Status: None   Collection Time: 04/14/18  6:16 AM  Result Value Ref Range Status   Adenovirus NOT DETECTED NOT DETECTED Final   Coronavirus 229E NOT DETECTED NOT DETECTED Final   Coronavirus HKU1 NOT DETECTED NOT DETECTED Final   Coronavirus  NL63 NOT DETECTED NOT DETECTED Final   Coronavirus OC43 NOT DETECTED NOT DETECTED Final   Metapneumovirus NOT DETECTED NOT DETECTED Final   Rhinovirus / Enterovirus NOT DETECTED NOT DETECTED Final   Influenza A NOT DETECTED NOT DETECTED Final   Influenza B NOT DETECTED NOT DETECTED Final   Parainfluenza Virus 1 NOT DETECTED NOT DETECTED Final   Parainfluenza Virus 2 NOT DETECTED NOT DETECTED Final   Parainfluenza Virus 3 NOT DETECTED NOT DETECTED Final   Parainfluenza Virus 4 NOT DETECTED NOT DETECTED Final   Respiratory Syncytial Virus NOT DETECTED NOT DETECTED Final   Bordetella pertussis NOT DETECTED NOT DETECTED Final   Chlamydophila pneumoniae NOT DETECTED NOT DETECTED Final   Mycoplasma pneumoniae NOT DETECTED NOT DETECTED Final    Comment: Performed at [facility], [address], [place], [place] [address]  Group A Strep by PCR [facility] Only)     Status: Abnormal   Collection Time: 04/14/18  6:16 AM  Result Value Ref Range Status   Group A Strep by PCR DETECTED (A) NOT DETECTED Final    Comment: Performed at  [facility], [address] [place], [place] [address]  MRSA PCR Screening     Status: None   Collection Time: 04/14/18 10:33 AM  Result Value Ref Range Status   MRSA by PCR NEGATIVE NEGATIVE Final    Comment:        The GeneXpert MRSA Assay (FDA approved for NASAL specimens only), is one component of a comprehensive MRSA colonization surveillance program. It is not intended to diagnose MRSA infection nor to guide or monitor treatment for MRSA infections. Performed at [facility], [address] [place], [place] [address]     RADIOLOGY:  Ct Soft Tissue Neck W Contrast  Result Date: 04/17/2018 CLINICAL DATA:  47 y/o  M; neck swelling and tenderness for 4 days. EXAM: CT NECK WITH CONTRAST TECHNIQUE: Multidetector CT imaging of the neck was performed using the standard protocol following the bolus administration of intravenous contrast. CONTRAST:  75mL OMNIPAQUE IOHEXOL 300 MG/ML  SOLN COMPARISON:  04/14/2018 CT of the neck. FINDINGS: Pharynx and larynx: Decreased mucosal thickening of the oropharynx. No exophytic mass. Salivary glands: Persistent mild enhancement of the bilateral submandibular glands. No abnormality of parotid glands. Thyroid: Normal. Lymph nodes: Bilateral anterior cervical lymphadenopathy, likely reactive. No lymph node necrosis. Vascular: Negative. Limited intracranial: Negative. Visualized orbits: Negative. Mastoids and visualized paranasal sinuses: Bilateral maxillary sinus mucous retention cyst. Normal aeration of the mastoid air cells. Orbits are unremarkable. Skeleton: No acute or aggressive process. Upper chest: Negative. Other: Inflammatory changes within the bilateral submandibular compartments, anterior cervical triangles, and the superficial soft tissues of the anterior neck is markedly increased from the prior CT. Additionally, there is a developing fluid collection within the right strap muscles measuring 1.9 x 2.4 x 4.6 cm (volume = 11  cm^3) (AP x ML x CC series 7, image 73 and series 2, image 77). IMPRESSION: 1. Inflammation within the bilateral submandibular compartments and diffusely throughout the soft tissues of the anterior neck are markedly increased. Additionally, there is a new fluid collection within the right strap muscles measuring up to 4.6 cm (11 cc), probably a phlegmon/developing abscess. 2. Reactive bilateral anterior cervical lymphadenopathy. Electronically Signed   By: Mitzi Hansen M.D.   On: 04/17/2018 19:58   US Soft Tissue Head & Neck (non-thyroid)  Result Date: 04/19/2018 CLINICAL DATA:  Recent diagnosis of bilateral submandibular sialoadenitis, now with concern for developing  phlegmon/abscess. EXAM: ULTRASOUND OF HEAD/NECK SOFT TISSUES TECHNIQUE: Ultrasound examination of the head and neck soft tissues was performed in the area of clinical concern. COMPARISON:  Neck CT-04/17/2018; 04/14/2018 FINDINGS: Sonographic evaluation performed by the dictating interventional radiologist demonstrates markedly edematous tissues about neck bilaterally without definable/drainable fluid collection. No aspiration attempted. IMPRESSION: Edematous bilateral cervical tissues without definable/drainable fluid collection. No aspiration attempted. Electronically Signed   By: Simonne Come M.D.   On: 04/19/2018 11:04     Management plans discussed with the patient, family and they are in agreement.  CODE STATUS:     Code Status Orders  (From admission, onward)         Start     Ordered   04/17/18 2021  Full code  Continuous     04/17/18 2020        Code Status History    Date Active Date Inactive Code Status Order ID Comments User Context   04/14/2018 1030 04/16/2018 1335 Full Code [id number]  Arnaldo Natal, MD Inpatient      TOTAL TIME TAKING CARE OF THIS PATIENT: *40* minutes.    Enedina Finner M.D on 04/19/2018 at 1:39 PM  Between 7am to 6pm - Pager - [phone] After 6pm go to [email or web address] -  password [email or web address]  [facility]  Office  [phone]  CC: Primary care physician; Patient, No Pcp Per

## 2018-04-19 NOTE — Progress Notes (Signed)
..   04/19/2018 7:09 AM  [name] [id number]   Temp:  [97.9 F (36.6 C)-98.4 F (36.9 C)] 97.9 F (36.6 C) (11/07 0533) Pulse Rate:  [46-67] 61 (11/07 0535) Resp:  [16-18] 16 (11/07 0533) BP: (104-121)/(60-76) 104/63 (11/07 0533) SpO2:  [92 %-99 %] 99 % (11/07 0533),     Intake/Output Summary (Last 24 hours) at 04/19/2018 0709 Last data filed at 04/19/2018 0540 Gross per 24 hour  Intake 1114.06 ml  Output 1700 ml  Net -585.94 ml    No results found for this or any previous visit (from the past 24 hour(s)).  SUBJECTIVE:  No acute events.  Improved pain and edema overnight.  No breathing or swallowing difficulty.  OBJECTIVE:  GEN-  NAD, alert and oriented NECK-  Continued decrease in swelling on right lateral neck, continued enlargement of bilateral submental/anterior neck.  Anterior neck with some fluctuance today.  IMPRESSION:  Anterior neck cellulitis  PLAN:  Reviewed ID notes recommending drainage of left abscess.  No fluid or abscess on left side, fluid was noted on right side on CT scan.  Reviewed with IR yesterday who felt not abscess but more edema and did not feel at that time fluid could be drainage.  Exam does show some fluctuance at midline today and will order [place] for possible aspiration.  Jeffrey Gillespie 04/19/2018, 7:09 AM

## 2018-04-19 NOTE — Progress Notes (Signed)
Discharge order received. Patient is alert and oriented. Vital signs stable . No signs of acute distress. Discharge instructions given. Patient verbalized understanding. No other issues noted at this time.   

## 2018-04-20 ENCOUNTER — Inpatient Hospital Stay
Admission: EM | Admit: 2018-04-20 | Discharge: 2018-04-23 | DRG: 581 | Disposition: A | Discharge status: Home / Self Care | Payer: Self-pay | Attending: Otolaryngology | Admitting: Otolaryngology

## 2018-04-20 ENCOUNTER — Emergency Department: Payer: Self-pay

## 2018-04-20 ENCOUNTER — Emergency Department: Payer: Self-pay | Admitting: Anesthesiology

## 2018-04-20 ENCOUNTER — Encounter
Admission: EM | Disposition: A | Discharge status: Home / Self Care | Payer: Self-pay | Source: Home / Self Care | Attending: Otolaryngology

## 2018-04-20 ENCOUNTER — Encounter: Payer: Self-pay | Admitting: Emergency Medicine

## 2018-04-20 ENCOUNTER — Other Ambulatory Visit: Payer: Self-pay

## 2018-04-20 DIAGNOSIS — J020 Streptococcal pharyngitis: Secondary | ICD-10-CM | POA: Diagnosis present

## 2018-04-20 DIAGNOSIS — L0211 Cutaneous abscess of neck: Principal | ICD-10-CM | POA: Diagnosis present

## 2018-04-20 DIAGNOSIS — Z79899 Other long term (current) drug therapy: Secondary | ICD-10-CM

## 2018-04-20 DIAGNOSIS — L03221 Cellulitis of neck: Secondary | ICD-10-CM | POA: Diagnosis present

## 2018-04-20 DIAGNOSIS — R221 Localized swelling, mass and lump, neck: Secondary | ICD-10-CM

## 2018-04-20 HISTORY — PX: INCISION AND DRAINAGE ABSCESS: SHX5864

## 2018-04-20 LAB — COMPREHENSIVE METABOLIC PANEL
ALBUMIN: 3.5 g/dL (ref 3.5–5.0)
ALK PHOS: 63 U/L (ref 38–126)
ALT: 52 U/L — AB (ref 0–44)
AST: 22 U/L (ref 15–41)
Anion gap: 9 (ref 5–15)
BUN: 20 mg/dL (ref 6–20)
CALCIUM: 8.9 mg/dL (ref 8.9–10.3)
CHLORIDE: 101 mmol/L (ref 98–111)
CO2: 28 mmol/L (ref 22–32)
CREATININE: 0.97 mg/dL (ref 0.61–1.24)
GFR calc Af Amer: >60 mL/min (ref >60)
GFR calc non Af Amer: >60 mL/min (ref >60)
GLUCOSE: 101 mg/dL — AB (ref 70–99)
Potassium: 3.5 mmol/L (ref 3.5–5.1)
SODIUM: 138 mmol/L (ref 135–145)
Total Bilirubin: 0.9 mg/dL (ref 0.3–1.2)
Total Protein: 7.9 g/dL (ref 6.5–8.1)

## 2018-04-20 LAB — CBC
HEMATOCRIT: 48.4 % (ref 39.0–52.0)
HEMOGLOBIN: 16.3 g/dL (ref 13.0–17.0)
MCH: 28.7 pg (ref 26.0–34.0)
MCHC: 33.7 g/dL (ref 30.0–36.0)
MCV: 85.2 fL (ref 80.0–100.0)
Platelets: 468 10*3/uL — ABNORMAL HIGH (ref 150–400)
RBC: 5.68 MIL/uL (ref 4.22–5.81)
RDW: 13.2 % (ref 11.5–15.5)
WBC: 17.5 10*3/uL — AB (ref 4.0–10.5)
nRBC: 0 % (ref 0.0–0.2)

## 2018-04-20 SURGERY — INCISION AND DRAINAGE, ABSCESS
Anesthesia: General

## 2018-04-20 MED ORDER — HYDROMORPHONE HCL 1 MG/ML IJ SOLN
INTRAMUSCULAR | Status: AC
  Administered 2018-04-20: 0.25 mg via INTRAVENOUS
  Filled 2018-04-20: qty 1

## 2018-04-20 MED ORDER — IOHEXOL 300 MG/ML  SOLN
75.0000 mL | Freq: Once | INTRAMUSCULAR | Status: AC | PRN
  Administered 2018-04-20: 75 mL via INTRAVENOUS

## 2018-04-20 MED ORDER — SUCCINYLCHOLINE CHLORIDE 20 MG/ML IJ SOLN
INTRAMUSCULAR | Status: AC
  Filled 2018-04-20: qty 1

## 2018-04-20 MED ORDER — SUCCINYLCHOLINE CHLORIDE 20 MG/ML IJ SOLN
INTRAMUSCULAR | Status: DC | PRN
  Administered 2018-04-20: 120 mg via INTRAVENOUS

## 2018-04-20 MED ORDER — LIDOCAINE HCL (CARDIAC) PF 100 MG/5ML IV SOSY
PREFILLED_SYRINGE | INTRAVENOUS | Status: DC | PRN
  Administered 2018-04-20: 60 mg via INTRAVENOUS

## 2018-04-20 MED ORDER — ROCURONIUM BROMIDE 50 MG/5ML IV SOLN
INTRAVENOUS | Status: AC
  Filled 2018-04-20: qty 1

## 2018-04-20 MED ORDER — HYDROMORPHONE HCL 1 MG/ML IJ SOLN
0.2500 mg | INTRAMUSCULAR | Status: AC | PRN
  Administered 2018-04-20 (×8): 0.25 mg via INTRAVENOUS

## 2018-04-20 MED ORDER — LACTATED RINGERS IV SOLN
INTRAVENOUS | Status: DC | PRN
  Administered 2018-04-20: 18:00:00 via INTRAVENOUS

## 2018-04-20 MED ORDER — ROCURONIUM BROMIDE 100 MG/10ML IV SOLN
INTRAVENOUS | Status: DC | PRN
  Administered 2018-04-20: 30 mg via INTRAVENOUS
  Administered 2018-04-20: 10 mg via INTRAVENOUS
  Administered 2018-04-20: 1 mg via INTRAVENOUS

## 2018-04-20 MED ORDER — LIDOCAINE HCL (PF) 2 % IJ SOLN
INTRAMUSCULAR | Status: AC
  Filled 2018-04-20: qty 10

## 2018-04-20 MED ORDER — KCL IN DEXTROSE-NACL 20-5-0.45 MEQ/L-%-% IV SOLN
INTRAVENOUS | Status: DC
  Administered 2018-04-20 – 2018-04-21 (×3): via INTRAVENOUS
  Filled 2018-04-20 (×7): qty 1000

## 2018-04-20 MED ORDER — SODIUM CHLORIDE 0.9 % IV SOLN
3.0000 g | Freq: Four times a day (QID) | INTRAVENOUS | Status: AC
  Administered 2018-04-20 – 2018-04-21 (×4): 3 g via INTRAVENOUS
  Filled 2018-04-20 (×4): qty 3

## 2018-04-20 MED ORDER — HYDROCODONE-ACETAMINOPHEN 5-325 MG PO TABS: 1.0000 | ORAL_TABLET | ORAL | Status: DC | PRN

## 2018-04-20 MED ORDER — FENTANYL CITRATE (PF) 100 MCG/2ML IJ SOLN
INTRAMUSCULAR | Status: AC
  Filled 2018-04-20: qty 2

## 2018-04-20 MED ORDER — SUGAMMADEX SODIUM 200 MG/2ML IV SOLN
INTRAVENOUS | Status: DC | PRN
  Administered 2018-04-20: 171.4 mg via INTRAVENOUS

## 2018-04-20 MED ORDER — MIDAZOLAM HCL 2 MG/2ML IJ SOLN
INTRAMUSCULAR | Status: AC
  Filled 2018-04-20: qty 2

## 2018-04-20 MED ORDER — LIDOCAINE-EPINEPHRINE 2 %-1:100000 IJ SOLN
INTRAMUSCULAR | Status: DC | PRN
  Administered 2018-04-20: 7 mL

## 2018-04-20 MED ORDER — MIDAZOLAM HCL 2 MG/2ML IJ SOLN
INTRAMUSCULAR | Status: DC | PRN
  Administered 2018-04-20: 2 mg via INTRAVENOUS

## 2018-04-20 MED ORDER — FENTANYL CITRATE (PF) 100 MCG/2ML IJ SOLN
INTRAMUSCULAR | Status: DC | PRN
  Administered 2018-04-20: 50 ug via INTRAVENOUS
  Administered 2018-04-20 (×2): 25 ug via INTRAVENOUS

## 2018-04-20 MED ORDER — ONDANSETRON HCL 4 MG/2ML IJ SOLN
INTRAMUSCULAR | Status: AC
  Filled 2018-04-20: qty 2

## 2018-04-20 MED ORDER — HEPARIN SODIUM (PORCINE) 5000 UNIT/ML IJ SOLN
5000.0000 [IU] | Freq: Three times a day (TID) | INTRAMUSCULAR | Status: DC
  Administered 2018-04-21 – 2018-04-23 (×5): 5000 [IU] via SUBCUTANEOUS
  Filled 2018-04-20 (×5): qty 1

## 2018-04-20 MED ORDER — SUGAMMADEX SODIUM 200 MG/2ML IV SOLN
INTRAVENOUS | Status: AC
  Filled 2018-04-20: qty 2

## 2018-04-20 MED ORDER — PROPOFOL 500 MG/50ML IV EMUL
INTRAVENOUS | Status: AC
  Filled 2018-04-20: qty 50

## 2018-04-20 MED ORDER — SODIUM CHLORIDE 0.9 % IV SOLN
3.0000 g | Freq: Once | INTRAVENOUS | Status: AC
  Administered 2018-04-20: 3 g via INTRAVENOUS
  Filled 2018-04-20: qty 3

## 2018-04-20 MED ORDER — DOCUSATE SODIUM 100 MG PO CAPS
100.0000 mg | ORAL_CAPSULE | Freq: Two times a day (BID) | ORAL | Status: DC
  Administered 2018-04-20 – 2018-04-23 (×6): 100 mg via ORAL
  Filled 2018-04-20 (×6): qty 1

## 2018-04-20 MED ORDER — ONDANSETRON HCL 4 MG/2ML IJ SOLN
INTRAMUSCULAR | Status: DC | PRN
  Administered 2018-04-20: 4 mg via INTRAVENOUS

## 2018-04-20 MED ORDER — PROPOFOL 10 MG/ML IV BOLUS
INTRAVENOUS | Status: DC | PRN
  Administered 2018-04-20: 160 mg via INTRAVENOUS

## 2018-04-20 SURGICAL SUPPLY — 34 items
BLADE SURG 15 STRL LF DISP TIS (BLADE) ×1 IMPLANT
BLADE SURG 15 STRL SS (BLADE) ×2
BNDG GAUZE 4.5X4.1 6PLY STRL (MISCELLANEOUS) ×3 IMPLANT
CANISTER SUCT 1200ML W/VALVE (MISCELLANEOUS) ×3 IMPLANT
CORD BIP STRL DISP 12FT (MISCELLANEOUS) IMPLANT
COVER WAND RF STERILE (DRAPES) ×3 IMPLANT
DRAIN PENROSE 1/4X12 LTX (DRAIN) ×3 IMPLANT
DRAPE MAG INST 16X20 L/F (DRAPES) IMPLANT
DRSG TEGADERM 2-3/8X2-3/4 SM (GAUZE/BANDAGES/DRESSINGS) IMPLANT
DRSG TELFA 4X3 1S NADH ST (GAUZE/BANDAGES/DRESSINGS) IMPLANT
ELECT REM PT RETURN 9FT ADLT (ELECTROSURGICAL) ×3
ELECTRODE REM PT RTRN 9FT ADLT (ELECTROSURGICAL) ×1 IMPLANT
FORCEPS JEWEL BIP 4-3/4 STR (INSTRUMENTS) IMPLANT
GLOVE BIO SURGEON STRL SZ7.5 (GLOVE) ×3 IMPLANT
GOWN STRL REUS W/ TWL LRG LVL3 (GOWN DISPOSABLE) ×2 IMPLANT
GOWN STRL REUS W/TWL LRG LVL3 (GOWN DISPOSABLE) ×4
HOOK STAY BLUNT/RETRACTOR 5M (MISCELLANEOUS) IMPLANT
JACKSON PRATT 7MM (INSTRUMENTS) IMPLANT
KIT TURNOVER KIT A (KITS) ×3 IMPLANT
LABEL OR SOLS (LABEL) IMPLANT
NS IRRIG 500ML POUR BTL (IV SOLUTION) ×3 IMPLANT
PACK HEAD/NECK (MISCELLANEOUS) ×3 IMPLANT
PAD ABD DERMACEA PRESS 5X9 (GAUZE/BANDAGES/DRESSINGS) ×6 IMPLANT
SHEARS HARMONIC 9CM CVD (BLADE) IMPLANT
SPONGE KITTNER 5P (MISCELLANEOUS) ×3 IMPLANT
SUCTION FRAZIER HANDLE 10FR (MISCELLANEOUS) ×2
SUCTION TUBE FRAZIER 10FR DISP (MISCELLANEOUS) ×1 IMPLANT
SUT ETHILON 3-0 FS-10 30 BLK (SUTURE) ×3
SUT PROLENE 3 0 FS 2 (SUTURE) IMPLANT
SUT SILK 3 0 (SUTURE) ×2
SUT SILK 3-0 18XBRD TIE 12 (SUTURE) ×1 IMPLANT
SUT VIC AB 4-0 FS2 27 (SUTURE) IMPLANT
SUT VIC AB 4-0 RB1 18 (SUTURE) ×3 IMPLANT
SUTURE EHLN 3-0 FS-10 30 BLK (SUTURE) ×1 IMPLANT

## 2018-04-20 NOTE — ED Notes (Signed)
Pt undressed and OR ready

## 2018-04-20 NOTE — ED Notes (Signed)
Patient transported to Ultrasound again at this time

## 2018-04-20 NOTE — H&P (Addendum)
Jeffrey Gillespie, Jeffrey Gillespie [id number] March 04, 1971  Date of Admission: _0 @ Admitting Physician: Riley Nearing  Chief Complaint: Neck swelling  HPI: This 47 y.o. year old male presented back to the emergency room with recurrent swelling in the anterior neck after being discharged home on Augmentin and prednisone.  Prior treatment had included Decadron and Unasyn.  At the time of his initial admission a screen for group A strep was noted to be positive.  A neck abscess was noted on prior CT scan but he responded to medical management, and no neck abscess could be demonstrated on repeat ultrasound yesterday.  Given his improvement he was discharged home.  Unfortunately the neck again swelled up substantially overnight.  He has had some dental pain on the left side involving molars of the left mandible.  I cannot appreciate any frank lucency around the molars on his CT scan.Repeat ultrasound today showed a new significant fluid accumulation.  This is fairly diffuse involving the submandibular space more significantly on the right than the left and appears to extend down into the lower neck around the strap muscles extending just down to the clavicle.  There is incidental note of a small pellet-like foreign body in the left neck and he is also noted on CT scan to have a chin implant although there is not a lot of edema immediately around the implant.  The patient had noted some ongoing issues with molars on the left side for some time, and this seemed to worsen prior to the onset of the initial neck swelling, so the possibility of dental etiology has been suspected.  Titers for mumps were negative.   Medications:  (Not in a hospital admission)  Allergies: No Known Allergies  PMH: History reviewed. No pertinent past medical history.  Fam Hx:  Family History  Problem Relation Age of Onset  . Diabetes Mellitus II Neg Hx   . CAD Neg Hx     Soc Hx:  Social History   Socioeconomic History  . Marital  status: Married    Spouse name: Not on file  . Number of children: Not on file  . Years of education: Not on file  . Highest education level: Not on file  Occupational History  . Not on file  Social Needs  . Financial resource strain: Not on file  . Food insecurity:    Worry: Not on file    Inability: Not on file  . Transportation needs:    Medical: Not on file    Non-medical: Not on file  Tobacco Use  . Smoking status: Never Smoker  . Smokeless tobacco: Never Used  Substance and Sexual Activity  . Alcohol use: Not Currently  . Drug use: Not Currently  . Sexual activity: Not on file  Lifestyle  . Physical activity:    Days per week: Not on file    Minutes per session: Not on file  . Stress: Not on file  Relationships  . Social connections:    Talks on phone: Not on file    Gets together: Not on file    Attends religious service: Not on file    Active member of club or organization: Not on file    Attends meetings of clubs or organizations: Not on file    Relationship status: Not on file  . Intimate partner violence:    Fear of current or ex partner: Not on file    Emotionally abused: Not on file    Physically abused: Not on file  Forced sexual activity: Not on file  Other Topics Concern  . Not on file  Social History Narrative  . Not on file    PSH:  Past Surgical History:  Procedure Laterality Date  . none    .   ROS: Negative for difficulty swallowing or sore throat currently.  He denies difficulty breathing.Marland Kitchen Positive for dental pain, low-grade fever.Marland Kitchen   PHYSICAL EXAM  Vitals: Blood pressure 108/73, pulse 82, temperature 98 F (36.7 C), temperature source Oral, resp. rate 14, height _0  (1.803 m), weight 85.7 kg, SpO2 98 %.. General: Well-developed, Well-nourished in no acute distress Mood: Mood and affect well adjusted, pleasant and cooperative. Orientation: Grossly alert and oriented. Vocal Quality: No hoarseness. Communicates verbally. head and  Face: NCAT. No facial asymmetry. No visible skin lesions. No significant facial scars. No tenderness with sinus percussion. Facial strength normal and symmetric. Ears: External ears with normal landmarks, no lesions. External auditory canals free of infection, cerumen impaction or lesions. Tympanic membranes intact with good landmarks and normal mobility on pneumatic otoscopy. No middle ear effusion. Hearing: Speech reception grossly normal. Nose: External nose normal with midline dorsum and no lesions or deformity. Nasal Cavity reveals essentially midline septum with normal inferior turbinates. No significant mucosal congestion or erythema. Nasal secretions are minimal and clear. No polyps seen on anterior rhinoscopy. Oral Cavity/ Oropharynx: Lips are normal with no lesions. Teeth reveal some prior dental extractions and at least 1 of the mandibular molars on the left shows evidence of decay, although no frank swelling around the gingiva.. Gingiva healthy with no lesions or gingivitis. Oropharynx including tongue, buccal mucosa, floor of mouth, hard and soft palate, uvula and posterior pharynx free of exudates, erythema or lesions with normal symmetry and hydration.  There is no swelling in the floor of the mouth. Indirect Laryngoscopy/Nasopharyngoscopy: Visualization of the larynx, hypopharynx and nasopharynx is not possible in this setting with routine examination. Neck: The neck is notable for firm edema diffusely in the submandibular region and extending two thirds of the way down the neck, slightly fluctuant, and mild to moderate tenderness.  There is minimal erythema of the skin. Lymphatic: Cervical lymph nodes cannot be palpated due to edema Respiratory: Normal respiratory effort without labored breathing.  Lungs are clear to auscultation bilaterally Cardiovascular: Carotid pulse shows regular rate and rhythm.  The heart has a regular rate and rhythm without murmurs.   Neurologic: Cranial Nerves II  through XII are grossly intact. Eyes: Gaze and Ocular Motility are grossly normal. PERRLA. No visible nystagmus.  MEDICAL DECISION MAKING: Data Review:  Results for orders placed or performed during the hospital encounter of 04/20/18 (from the past 48 hour(s))  CBC     Status: Abnormal   Collection Time: 04/20/18 10:24 AM  Result Value Ref Range   WBC 17.5 (H) 4.0 - 10.5 K/uL   RBC 5.68 4.22 - 5.81 MIL/uL   Hemoglobin 16.3 13.0 - 17.0 g/dL   HCT 48.4 39.0 - 52.0 %   MCV 85.2 80.0 - 100.0 fL   MCH 28.7 26.0 - 34.0 pg   MCHC 33.7 30.0 - 36.0 g/dL   RDW 13.2 11.5 - 15.5 %   Platelets 468 (H) 150 - 400 K/uL   nRBC 0.0 0.0 - 0.2 %    Comment: Performed at [facility], [address], [place], [place] [address]  Comprehensive metabolic panel     Status: Abnormal   Collection Time: 04/20/18 10:24 AM  Result Value Ref Range  Sodium 138 135 - 145 mmol/L   Potassium 3.5 3.5 - 5.1 mmol/L   Chloride 101 98 - 111 mmol/L   CO2 28 22 - 32 mmol/L   Glucose, Bld 101 (H) 70 - 99 mg/dL   BUN 20 6 - 20 mg/dL   Creatinine, Ser 0.97 0.61 - 1.24 mg/dL   Calcium 8.9 8.9 - 10.3 mg/dL   Total Protein 7.9 6.5 - 8.1 g/dL   Albumin 3.5 3.5 - 5.0 g/dL   AST 22 15 - 41 U/L   ALT 52 (H) 0 - 44 U/L   Alkaline Phosphatase 63 38 - 126 U/L   Total Bilirubin 0.9 0.3 - 1.2 mg/dL   GFR calc non Af Amer >60 >60 mL/min   GFR calc Af Amer >60 >60 mL/min    Comment: (NOTE) The eGFR has been calculated using the CKD EPI equation. This calculation has not been validated in all clinical situations. eGFR's persistently <60 mL/min signify possible Chronic Kidney Disease.    Anion gap 9 5 - 15    Comment: Performed at [facility], [address] [place], [place] [address]  . Ct Soft Tissue Neck W Contrast  Result Date: 04/20/2018 CLINICAL DATA:  Recurrent neck swelling. EXAM: CT NECK WITH CONTRAST TECHNIQUE: Multidetector CT imaging of the neck was performed using the standard protocol  following the bolus administration of intravenous contrast. CONTRAST:  52m OMNIPAQUE IOHEXOL 300 MG/ML  SOLN COMPARISON:  CT neck April 17, 2018 FINDINGS: PHARYNX AND LARYNX: RIGHT parapharyngeal edema surrounding the hyoid with effusion, no discrete enhancement. Fluid collection extends to the mylohyoid without extension into floor mouth. Patent airway. Normal epiglottis and larynx. SALIVARY GLANDS: Rim enhancing abscess within RIGHT tail of the parotid, RIGHT submandibular gland. Mild RIGHT submandibular gland ductal dilatation without sialolith. Collection extends to the retromolar trigone bilaterally. THYROID: Normal. LYMPH NODES: No lymphadenopathy by CT size criteria. VASCULAR: Edema and effusion into RIGHT carotid space. LIMITED INTRACRANIAL: Normal. VISUALIZED ORBITS: Normal. 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 Small RIGHT maxillary sinus air-fluid level. LEFT maxillary mucosal retention cyst. Minimal LEFT mastoid effusion. SKELETON: Nonacute. Unerupted accessory RIGHT maxillary incisor. No CT findings of dental pathology. No CT findings of discitis osteomyelitis or acute process in the spine. UPPER CHEST: Lung apices are clear. No superior mediastinal lymphadenopathy. OTHER: Multiloculated 4.5 x 8.6 by 7.8 cm (transverse by AP by cc) rim enhancing fluid collection anterior neck extending to the RIGHT. Collection within the RIGHT strap muscle and contiguous with versus involving the anterior RIGHT sternocleidomastoid muscle. Thickened RIGHT platysma with extensive RIGHT neck subcutaneous fat stranding. No subcutaneous gas. Tiny metallic foreign body LEFT anterior neck superficial to the platysma. Chin implant within mandible pre symphyseal soft tissues. IMPRESSION: 1. Large multiloculated anterior neck abscess and effusion with cellulitis and myositis. Collection surrounds the mandible and hyoid without CT findings of dental  pathology or osteomyelitis though MRI is more sensitive. 2. Abscess extending into RIGHT submandibular gland, RIGHT parotid gland and RIGHT carotid space. 3. Patent airway. 4. Acute findings discussed with and reconfirmed by Dr.ROBERT KCorky Downson 04/20/2018 at 4:01 pm. Electronically Signed   By: CElon AlasM.D.   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 EXAM: ULTRASOUND OF HEAD/NECK SOFT TISSUES TECHNIQUE: Ultrasound examination of the head and neck soft tissues was performed in the area of clinical concern. COMPARISON:  Soft tissue neck ultrasound-04/19/2018; neck CT-04/17/2018; 04/14/2018 FINDINGS: Sonographic evaluation of the neck demonstrates development of a large (at least 9.2 x 6.5 x 2.1 cm) fluid collection/abscess involving the right side of the neck which was not seen on soft tissue neck ultrasound performed day prior. IMPRESSION: Interval development of approximately 9.2 cm presumed abscess within the right-side of the neck. Critical Value/emergent results were called by telephone at the time of interpretation on 04/20/2018 at 3:07 pm to Drs. Corky Downs (ED) and Richardson Landry (ENT), who verbally acknowledged these results. Electronically Signed   By: Sandi Mariscal M.D.   On: 04/20/2018 15:08   US Soft Tissue Head & Neck (non-thyroid)  Result Date: 04/19/2018 CLINICAL DATA:  Recent diagnosis of bilateral submandibular sialoadenitis, now with concern for developing phlegmon/abscess. EXAM: ULTRASOUND OF HEAD/NECK SOFT TISSUES TECHNIQUE: Ultrasound examination of the head and neck soft tissues was performed in the area of clinical concern. COMPARISON:  Neck CT-04/17/2018; 04/14/2018 FINDINGS: Sonographic evaluation performed by the dictating interventional radiologist demonstrates markedly edematous tissues about neck bilaterally without definable/drainable fluid collection. No aspiration attempted. IMPRESSION:  Edematous bilateral cervical tissues without definable/drainable fluid collection. No aspiration attempted. Electronically Signed   By: Sandi Mariscal M.D.   On: 04/19/2018 11:04  .   ASSESSMENT: Neck abscess and cellulitis of uncertain etiology, possibly coming from a dental source, now with recurrent fluid accumulation.  PLAN: I discussed with the patient taking him to the operating room for incision and drainage and placement of drains.  He understands that the wounds will be left open to allow ongoing drainage until this resolves.  Cultures will be taken as well.  He does have a chin implant but with no immediately surrounding inflammation I would not suggest removing it with this procedure.  Given his dental pain and some evidence of dental caries, dental etiology is suspected.  I informed him I would need to make 2 incisions, one higher in the neck, and one lower near the region of the thyroid gland.  The abscess extends down to the level of clavicles.  If he does not respond clinically to incision and drainage and further antibiotic therapy, we would have to watch closely to make sure he does not develop mediastinitis which would necessitate transfer to [facility].  We also discussed that if the source is indeed dental in origin, the teeth will need to be extracted at another date.  I explained that we do not have dental services at this hospital and I am not qualified for dental extractions.  He is aware that dental services are available at other hospitals or this can be done on an outpatient basis with his dentist, but he agrees to proceed with incision and drainage here of the neck abscess.   Riley Nearing 04/20/2018 4:22 PM

## 2018-04-20 NOTE — ED Notes (Signed)
Pt assisted to bathroom

## 2018-04-20 NOTE — Transfer of Care (Addendum)
Immediate Anesthesia Transfer of Care Note  Patient: Kaydan Wong  Procedure(s) Performed: INCISION AND DRAINAGE ABSCESS (N/A )  Patient Location: PACU  Anesthesia Type:General  Level of Consciousness: awake and alert   Airway & Oxygen Therapy: Patient Spontanous Breathing and Patient connected to face mask oxygen  Post-op Assessment: Report given to RN and Post -op Vital signs reviewed and stable  Post vital signs: Reviewed and stable  Last Vitals:  Vitals Value Taken Time  BP    Temp    Pulse    Resp    SpO2      Last Pain:  Vitals:   04/20/18 1108  TempSrc:   PainSc: 8          Complications: No apparent anesthesia complications

## 2018-04-20 NOTE — ED Notes (Signed)
Interpretor at bedside. 

## 2018-04-20 NOTE — Anesthesia Preprocedure Evaluation (Addendum)
Anesthesia Evaluation  Patient identified by MRN, date of birth, ID band Patient awake    Reviewed: Allergy & Precautions, H&P , NPO status , Patient's Chart, lab work & pertinent test results  Airway Mallampati: III   Neck ROM: limited  Mouth opening: Limited Mouth Opening  Dental  (+) Teeth Intact   Pulmonary neg pulmonary ROS,    breath sounds clear to auscultation       Cardiovascular + Peripheral Vascular Disease   Rhythm:regular Rate:Normal     Neuro/Psych negative neurological ROS  negative psych ROS   GI/Hepatic negative GI ROS, Neg liver ROS,   Endo/Other  negative endocrine ROS  Renal/GU      Musculoskeletal   Abdominal   Peds  Hematology negative hematology ROS (+)   Anesthesia Other Findings History reviewed. No pertinent past medical history.  Past Surgical History: No date: none  BMI    Body Mass Index:  26.36 kg/m      Reproductive/Obstetrics negative OB ROS                            Anesthesia Physical Anesthesia Plan  ASA: II and emergent  Anesthesia Plan: General ETT   Post-op Pain Management:    Induction:   PONV Risk Score and Plan: Ondansetron and Dexamethasone  Airway Management Planned:   Additional Equipment:   Intra-op Plan:   Post-operative Plan:   Informed Consent: I have reviewed the patients History and Physical, chart, labs and discussed the procedure including the risks, benefits and alternatives for the proposed anesthesia with the patient or authorized representative who has indicated his/her understanding and acceptance.   Dental Advisory Given  Plan Discussed with: Anesthesiologist, CRNA and Surgeon  Anesthesia Plan Comments:        Anesthesia Quick Evaluation

## 2018-04-20 NOTE — ED Triage Notes (Signed)
Pt to ED via POV. Pt has been admitted twice recently for Ludwig's angina. Pt states that yesterday when he was D/C that the swelling in his neck had gotten better but now the swelling is getting worse again. Pt states that it is hard to swallow food or liquids. Pt denies any trouble breathing. Pt is in NAD at this time.

## 2018-04-20 NOTE — ED Notes (Signed)
Patient transported to Ultrasound 

## 2018-04-20 NOTE — ED Notes (Signed)
FIRST NURSE NOTE:  Pt seen here recently for Ludwig's Angina discharged yesterday, c/o worse swelling, respirations equal and unlabored at this time.

## 2018-04-20 NOTE — Anesthesia Postprocedure Evaluation (Signed)
Anesthesia Post Note  Patient: Jeffrey Gillespie  Procedure(s) Performed: INCISION AND DRAINAGE ABSCESS (N/A )  Patient location during evaluation: PACU Anesthesia Type: General Level of consciousness: awake and alert Pain management: pain level controlled Vital Signs Assessment: post-procedure vital signs reviewed and stable Respiratory status: spontaneous breathing, nonlabored ventilation and respiratory function stable Cardiovascular status: blood pressure returned to baseline and stable Postop Assessment: no apparent nausea or vomiting Anesthetic complications: no     Last Vitals:  Vitals:   04/20/18 2030 04/20/18 2057  BP: 121/82 98/73  Pulse: 99 89  Resp: 20 17  Temp: 37.2 C 37.3 C  SpO2: 96% 96%    Last Pain:  Vitals:   04/20/18 2057  TempSrc: Oral  PainSc:                  Jovita Gamma

## 2018-04-20 NOTE — ED Notes (Signed)
Transported to OR with OR tech. VSS. Pt alert and oriented upon leaving ED

## 2018-04-20 NOTE — ED Notes (Signed)
Consult at bedside.

## 2018-04-20 NOTE — Anesthesia Procedure Notes (Signed)
Procedure Name: Intubation Date/Time: 04/20/2018 6:25 PM Performed by: Henrietta Hoover, CRNA Pre-anesthesia Checklist: Patient identified, Patient being monitored, Timeout performed, Emergency Drugs available and Suction available Patient Re-evaluated:Patient Re-evaluated prior to induction Oxygen Delivery Method: Circle system utilized Preoxygenation: Pre-oxygenation with 100% oxygen Induction Type: IV induction Ventilation: Mask ventilation without difficulty Laryngoscope Size: McGraph and 4 Grade View: Grade I Tube type: Oral Tube size: 7.5 mm Number of attempts: 1 Airway Equipment and Method: Stylet Placement Confirmation: ETT inserted through vocal cords under direct vision,  positive ETCO2 and breath sounds checked- equal and bilateral Secured at: 24 cm Tube secured with: Tape Dental Injury: Teeth and Oropharynx as per pre-operative assessment

## 2018-04-20 NOTE — Op Note (Signed)
04/20/2018  7:51 PM    [name]  [id number]   Pre-Op Diagnosis:  NECK ABSCESS  Post-op Diagnosis: NECK ABSCESS  Procedure: Incision and drainage of neck abscess  Surgeon:  [name]  Anesthesia:  General endoracheal  EBL: Less than 25 cc  Complications:  None  Findings: Extensive collection of purulence extending from the right submandibular space past midline into the proximal left submandibular space, and inferiorly a central tract that extended down to the level of the clavicle.  Cultures were obtained and Penrose drains placed  Procedure: After the patient was identified in holding and the procedure was reviewed.  The patient was taken to the operating room and with the patient in a comfortable supine position,  general endotracheal anesthesia was induced without difficulty.  A proper time-out was performed, confirming the operative site and procedure.  1% lidocaine with epinephrine 1-100,000 was injected in the skin in the midline in the submental region and in the midline just over the thyroid region.  The patient was then prepped and draped in the usual sterile fashion.  A 15 blade was used to incise the skin in the submental region.  Purulence was immediately encountered and dissection proceeded bluntly below the platysma with a combination of finger dissection and use of a Kelly clamp to open up loculations in the submandibular space both to the right and the left of midline.  Dissection then proceeded inferiorly as the tract was known from the CT scan to extend down to the level of the clavicles.  A separate incision was made over the region of the thyroid as there was still some fullness there.  Dissection proceeded into the subcutaneous tissues and through the platysma, draining small amount of residual purulence, and joining up the communication between the superior dissection and the inferior dissection.  These areas were all then vigorously irrigated with over a  liter of saline until clear.  The areas were again palpated with a finger and probed with a Kelly clamp to make sure no further loculations were encountered.  Large Penrose drains were placed on either side of the submandibular spaces, and a smaller Penrose was passed through the inferior incision all the way up through the superior incision.  The Penrose drains were secured with 3-0 Ethilon suture.  The patient was then returned to the anesthesiologist in good condition for awakening. The patient was awakened and taken to the recovery room in good condition.   Disposition:   PACU then to the floor for admission  Plan: Continue IV antibiotics and observation.  The drains will eventually be slowly backed out and the wounds allowed to heal by secondary intention.  [name] 04/20/2018 7:51 PM

## 2018-04-20 NOTE — ED Notes (Signed)
Per Seward Speck, discussed with Dr. Cyril Loosen, new orders received.

## 2018-04-20 NOTE — ED Notes (Signed)
Pt waiting [place]. NAD.

## 2018-04-20 NOTE — Anesthesia Post-op Follow-up Note (Signed)
Anesthesia QCDR form completed.        

## 2018-04-20 NOTE — ED Provider Notes (Signed)
[facility] Emergency Department Provider Note   ____________________________________________    I have reviewed the triage vital signs and the nursing notes.   HISTORY  Chief Complaint Facial Swelling   Interpreter used, Spanish  HPI Jeffrey Gillespie is a 47 y.o. male who presents with complaints of neck swelling.  Patient recently discharged from the hospital yesterday after being treated for neck swelling of unclear origin.  Thought to be odontogenic but unclear.  Attempted IR drainage not performed given likely edema as opposed to abscess.  Patient states that he was better yesterday but after about 6 to 8 hours at home he has had significant increase in swelling again.  Denies difficult he breathing.  Denies difficulty swallowing.  No fevers reported.   History reviewed. No pertinent past medical history.  Patient Active Problem List   Diagnosis Date Noted  . Pharyngitis due to group A beta hemolytic Streptococci 04/17/2018  . Pharyngeal edema 04/14/2018    Past Surgical History:  Procedure Laterality Date  . none      Prior to Admission medications   Medication Sig Start Date End Date Taking? Authorizing Provider  amoxicillin-clavulanate (AUGMENTIN) 875-125 MG tablet Take 1 tablet by mouth 2 (two) times daily for 7 days. 04/16/18 04/23/18 Yes Katha Hamming, MD  predniSONE (STERAPRED UNI-PAK 21 TAB) 10 MG (21) TBPK tablet Taper by 10 mg daily Patient taking differently: Take 10-60 mg by mouth See admin instructions. 60 mg daily on day 1, then 50 mg daily on day 2, then 40 mg daily on day 3, then 30 mg daily on day 4, then 20 mg daily on day 5, then 10 mg daily on day 6, then stop 04/16/18  Yes Katha Hamming, MD     Allergies Patient has no known allergies.  Family History  Problem Relation Age of Onset  . Diabetes Mellitus II Neg Hx   . CAD Neg Hx     Social History Social History   Tobacco Use  . Smoking status: Never  Smoker  . Smokeless tobacco: Never Used  Substance Use Topics  . Alcohol use: Not Currently  . Drug use: Not Currently    Review of Systems  Constitutional: No fever/chills Eyes: No visual changes.  ENT: As above Cardiovascular: Denies chest pain. Respiratory: No difficulty breathing Gastrointestinal: No abdominal pain.  No nausea, no vomiting.   Genitourinary: Negative for dysuria. Musculoskeletal: Negative for back pain. Skin: No rash Neurological: Negative for headaches   ____________________________________________   PHYSICAL EXAM:  VITAL SIGNS: ED Triage Vitals  Enc Vitals Group     BP 04/20/18 0951 117/81     Pulse Rate 04/20/18 0951 92     Resp 04/20/18 0951 18     Temp 04/20/18 0951 98 F (36.7 C)     Temp Source 04/20/18 0951 Oral     SpO2 04/20/18 0951 99 %     Weight 04/20/18 0952 85.7 kg (189 lb)     Height 04/20/18 0952 1.803 m (5\' 11" )     Head Circumference --      Peak Flow --      Pain Score 04/20/18 1000 7     Pain Loc --      Pain Edu? --      Excl. in GC? --     Constitutional: Alert and oriented. Eyes: Conjunctivae are normal.  Head: Atraumatic. Nose: No congestion/rhinnorhea. Mouth/Throat: Mucous membranes are moist.  Significant anterior neck swelling, right greater than left, mildly  tender, minimally erythematous.  Floor of mouth is soft, nontender, pharynx is normal. Neck: No vertebral tenderness to palpation Cardiovascular: Normal rate, regular rhythm. Grossly normal heart sounds.  Good peripheral circulation. Respiratory: Normal respiratory effort.  No retractions.  No stridor Gastrointestinal: Soft and nontender. No distention  Musculoskeletal: No lower extremity tenderness nor edema.  Warm and well perfused Neurologic:  Normal speech and language. No gross focal neurologic deficits are appreciated.  Skin:  Skin is warm, dry and intact. No rash noted. Psychiatric: Mood and affect are normal. Speech and behavior are  normal.  ____________________________________________   LABS (all labs ordered are listed, but only abnormal results are displayed)  Labs Reviewed  CBC - Abnormal; Notable for the following components:      Result Value   WBC 17.5 (*)    Platelets 468 (*)    All other components within normal limits  COMPREHENSIVE METABOLIC PANEL - Abnormal; Notable for the following components:   Glucose, Bld 101 (*)    ALT 52 (*)    All other components within normal limits   ____________________________________________  EKG  None ____________________________________________  RADIOLOGY  Ultrasound soft tissue head neck ____________________________________________   PROCEDURES  Procedure(s) performed: No  Procedures   Critical Care performed: yes  CRITICAL CARE Performed by: Jene Every   Total critical care time: 30 minutes  Critical care time was exclusive of separately billable procedures and treating other patients.  Critical care was necessary to treat or prevent imminent or life-threatening deterioration.  Critical care was time spent personally by me on the following activities: development of treatment plan with patient and/or surrogate as well as nursing, discussions with consultants, evaluation of patient's response to treatment, examination of patient, obtaining history from patient or surrogate, ordering and performing treatments and interventions, ordering and review of laboratory studies, ordering and review of radiographic studies, pulse oximetry and re-evaluation of patient's condition.  ____________________________________________   INITIAL IMPRESSION / ASSESSMENT AND PLAN / ED COURSE  Pertinent labs & imaging results that were available during my care of the patient were reviewed by me and considered in my medical decision making (see chart for details).  Patient presents with significant anterior neck swelling.  On review of records CT scan did show a  4 cm fluid collection.  Given presentation I am inclined to believe that there is an abscess that likely needs drainage.  Discussed with Dr. Willeen Cass of ENT given that ENT had followed the patient in the hospital, he states that thus far there has not been any sort of surgical problem that needs addressing by ENT at this time.  He recommends IV antibiotics and admission to the hospital service  IV Unasyn started  Feel the patient needs reimaging however he is very hesitant to undergo repeat CT scanning which is not surprising given that this would be his third CT scan in a week.  Discussed with Dr. Margo Aye of radiology whether MRI may be beneficial and he recommends repeat ultrasound   ----------------------------------------- 3:05 PM on 04/20/2018 -----------------------------------------  Discussed with Dr. Grace Isaac of radiology, he is interpreting the ultrasound and feels there is likely a drainable collection.  He has conferred with Dr. Willeen Cass of ENT.  Dr. Willeen Cass requests CT scan for surgical planning, he will admit      ____________________________________________   FINAL CLINICAL IMPRESSION(S) / ED DIAGNOSES  Final diagnoses:  Neck swelling  Abscess of neck        Note:  This document was prepared using  [employer] and may include unintentional dictation errors.      Jene Every, MD 04/20/18 [phone]

## 2018-04-20 NOTE — ED Notes (Signed)
Patient transported to CT 

## 2018-04-21 ENCOUNTER — Encounter: Payer: Self-pay | Admitting: Otolaryngology

## 2018-04-21 NOTE — Progress Notes (Signed)
Moderate amount of purulent drainage from penrose drains. Dressing changed with ABD pads and guaze. Pt denies swallowing difficulty and pain. Mother at bedside

## 2018-04-21 NOTE — Progress Notes (Signed)
Patient ID: Jeffrey Gillespie, male   DOB: 1970-08-19, 47 y.o.   MRN: [id number] Jeffrey Gillespie, Jeffrey Gillespie [id number] [birth year] Riley Nearing, MD   SUBJECTIVE: This 47 y.o. year old male is status post Incision and drainage of a large multiloculated neck abscess. He feels much better today with little pain, and fever resolved overnight. Hungry and feels he can eat a normal diet.  Medications:  Current Facility-Administered Medications  Medication Dose Route Frequency Provider Last Rate Last Dose  . Ampicillin-Sulbactam (UNASYN) 3 g in sodium chloride 0.9 % 100 mL IVPB  3 g Intravenous Q6H Clyde Canterbury, MD 200 mL/hr at 04/21/18 0904 3 g at 04/21/18 0904  . dextrose 5 % and 0.45 % NaCl with KCl 20 mEq/L infusion   Intravenous Continuous Clyde Canterbury, MD 100 mL/hr at 04/21/18 0526    . docusate sodium (COLACE) capsule 100 mg  100 mg Oral BID Clyde Canterbury, MD   100 mg at 04/21/18 0856  . heparin injection 5,000 Units  5,000 Units Subcutaneous Andee Poles, MD   5,000 Units at 04/21/18 0856  . HYDROcodone-acetaminophen (NORCO/VICODIN) 5-325 MG per tablet 1-2 tablet  1-2 tablet Oral Q4H PRN Clyde Canterbury, MD      .  Medications Prior to Admission  Medication Sig Dispense Refill  . amoxicillin-clavulanate (AUGMENTIN) 875-125 MG tablet Take 1 tablet by mouth 2 (two) times daily for 7 days. 14 tablet 0  . predniSONE (STERAPRED UNI-PAK 21 TAB) 10 MG (21) TBPK tablet Taper by 10 mg daily (Patient taking differently: Take 10-60 mg by mouth See admin instructions. 60 mg daily on day 1, then 50 mg daily on day 2, then 40 mg daily on day 3, then 30 mg daily on day 4, then 20 mg daily on day 5, then 10 mg daily on day 6, then stop) 21 tablet 0    OBJECTIVE:  PHYSICAL EXAM  Vitals: Blood pressure 101/67, pulse 78, temperature 98.3 F (36.8 C), temperature source Oral, resp. rate 18, height 5' 11"  (1.803 m), weight 85.7 kg, SpO2 97 %.. General: Well-developed, Well-nourished in no acute distress Mood: Mood  and affect well adjusted, pleasant and cooperative. Orientation: Grossly alert and oriented. Vocal Quality: No hoarseness. Communicates verbally. head and Face: NCAT. No facial asymmetry. No visible skin lesions. No significant facial scars. No tenderness with sinus percussion. Facial strength normal and symmetric. Neck: Drains in place with mostly serous drainage, no gross purulence. Still tender but swelling dramatically improved with minimal induration Lymphatic: Cervical lymph nodes are without palpable lymphadenopathy or tenderness. Respiratory: Normal respiratory effort without labored breathing.  MEDICAL DECISION MAKING: Data Review:  Results for orders placed or performed during the hospital encounter of 04/20/18 (from the past 48 hour(s))  CBC     Status: Abnormal   Collection Time: 04/20/18 10:24 AM  Result Value Ref Range   WBC 17.5 (H) 4.0 - 10.5 K/uL   RBC 5.68 4.22 - 5.81 MIL/uL   Hemoglobin 16.3 13.0 - 17.0 g/dL   HCT 48.4 39.0 - 52.0 %   MCV 85.2 80.0 - 100.0 fL   MCH 28.7 26.0 - 34.0 pg   MCHC 33.7 30.0 - 36.0 g/dL   RDW 13.2 11.5 - 15.5 %   Platelets 468 (H) 150 - 400 K/uL   nRBC 0.0 0.0 - 0.2 %    Comment: Performed at [facility], [address], [place], [place] [address]  Comprehensive metabolic panel     Status: Abnormal   Collection Time: 04/20/18 10:24  AM  Result Value Ref Range   Sodium 138 135 - 145 mmol/L   Potassium 3.5 3.5 - 5.1 mmol/L   Chloride 101 98 - 111 mmol/L   CO2 28 22 - 32 mmol/L   Glucose, Bld 101 (H) 70 - 99 mg/dL   BUN 20 6 - 20 mg/dL   Creatinine, Ser 0.97 0.61 - 1.24 mg/dL   Calcium 8.9 8.9 - 10.3 mg/dL   Total Protein 7.9 6.5 - 8.1 g/dL   Albumin 3.5 3.5 - 5.0 g/dL   AST 22 15 - 41 U/L   ALT 52 (H) 0 - 44 U/L   Alkaline Phosphatase 63 38 - 126 U/L   Total Bilirubin 0.9 0.3 - 1.2 mg/dL   GFR calc non Af Amer >60 >60 mL/min   GFR calc Af Amer >60 >60 mL/min    Comment: (NOTE) The eGFR has been calculated using the CKD  EPI equation. This calculation has not been validated in all clinical situations. eGFR's persistently <60 mL/min signify possible Chronic Kidney Disease.    Anion gap 9 5 - 15    Comment: Performed at [facility], [address] [place], [place] [address]  Aerobic/Anaerobic Culture (surgical/deep wound)     Status: None (Preliminary result)   Collection Time: 04/20/18  7:04 PM  Result Value Ref Range   Specimen Description      NECK Performed at [facility], [address], [place], [place] [address]    Special Requests      NONE Performed at [facility], [place], [place] [address]    Gram Stain      ABUNDANT WBC PRESENT, PREDOMINANTLY PMN RARE GRAM POSITIVE COCCI Performed at [facility], [place] [address] [place],  [address]    Culture PENDING    Report Status PENDING   . Ct Soft Tissue Neck W Contrast  Result Date: 04/20/2018 CLINICAL DATA:  Recurrent neck swelling. EXAM: CT NECK WITH CONTRAST TECHNIQUE: Multidetector CT imaging of the neck was performed using the standard protocol following the bolus administration of intravenous contrast. CONTRAST:  59m OMNIPAQUE IOHEXOL 300 MG/ML  SOLN COMPARISON:  CT neck April 17, 2018 FINDINGS: PHARYNX AND LARYNX: RIGHT parapharyngeal edema surrounding the hyoid with effusion, no discrete enhancement. Fluid collection extends to the mylohyoid without extension into floor mouth. Patent airway. Normal epiglottis and larynx. SALIVARY GLANDS: Rim enhancing abscess within RIGHT tail of the parotid, RIGHT submandibular gland. Mild RIGHT submandibular gland ductal dilatation without sialolith. Collection extends to the retromolar trigone bilaterally. THYROID: Normal. LYMPH NODES: No lymphadenopathy by CT size criteria. VASCULAR: Edema and effusion into RIGHT carotid space. LIMITED INTRACRANIAL: Normal. VISUALIZED ORBITS: Normal. 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 Small RIGHT maxillary sinus air-fluid level. LEFT maxillary mucosal retention cyst. Minimal LEFT mastoid effusion. SKELETON: Nonacute. Unerupted accessory RIGHT maxillary incisor. No CT findings of dental pathology. No CT findings of discitis osteomyelitis or acute process in the spine. UPPER CHEST: Lung apices are clear. No superior mediastinal lymphadenopathy. OTHER: Multiloculated 4.5 x 8.6 by 7.8 cm (transverse by AP by cc) rim enhancing fluid collection anterior neck extending to the RIGHT. Collection within the RIGHT strap muscle and contiguous with versus involving the anterior RIGHT sternocleidomastoid muscle. Thickened RIGHT platysma with extensive RIGHT neck subcutaneous fat stranding. No subcutaneous gas. Tiny metallic foreign body LEFT anterior neck superficial to the platysma. Chin implant within mandible pre symphyseal soft tissues. IMPRESSION:  1. Large multiloculated anterior neck abscess and effusion with cellulitis and myositis. Collection surrounds the mandible and hyoid without CT findings of dental pathology or osteomyelitis though MRI is more sensitive. 2. Abscess extending into RIGHT submandibular gland, RIGHT parotid gland and RIGHT carotid space. 3. Patent airway. 4. Acute findings discussed with and reconfirmed by Dr.ROBERT Corky Downs on 04/20/2018 at 4:01 pm. Electronically Signed   By: Elon Alas M.D.   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 EXAM: ULTRASOUND OF HEAD/NECK SOFT TISSUES TECHNIQUE: Ultrasound examination of the head and neck soft tissues was performed in the area of clinical concern. COMPARISON:  Soft tissue neck ultrasound-04/19/2018; neck CT-04/17/2018; 04/14/2018 FINDINGS: Sonographic evaluation of the neck demonstrates development of a large (at least 9.2 x 6.5 x 2.1 cm) fluid collection/abscess involving the right side  of the neck which was not seen on soft tissue neck ultrasound performed day prior. IMPRESSION: Interval development of approximately 9.2 cm presumed abscess within the right-side of the neck. Critical Value/emergent results were called by telephone at the time of interpretation on 04/20/2018 at 3:07 pm to Drs. Corky Downs (ED) and Richardson Landry (ENT), who verbally acknowledged these results. Electronically Signed   By: Sandi Mariscal M.D.   On: 04/20/2018 15:08  .   ASSESSMENT: Doing well after I&D with resolution of fever and wounds look great. This likely came from the documented Group A Strep pharyngitis. Abscess cultures pending. He may still want to have his teeth evaluated after d/c.  PLAN: Continue Unasyn and dressing changes. Will begin to back drains out tomorrow most likely. Advance diet.   Riley Nearing, MD 04/21/2018 10:35 AM

## 2018-04-21 NOTE — Plan of Care (Signed)
  Problem: Clinical Measurements: Goal: Ability to maintain clinical measurements within normal limits will improve Outcome: Progressing Goal: Postoperative complications will be avoided or minimized Outcome: Progressing   Problem: Skin Integrity: Goal: Demonstration of wound healing without infection will improve Outcome: Progressing   Problem: Clinical Measurements: Goal: Will remain free from infection Outcome: Progressing   Problem: Activity: Goal: Risk for activity intolerance will decrease Outcome: Progressing   Problem: Nutrition: Goal: Adequate nutrition will be maintained Outcome: Progressing   Problem: Elimination: Goal: Will not experience complications related to bowel motility Outcome: Progressing   Problem: Skin Integrity: Goal: Risk for impaired skin integrity will decrease Outcome: Progressing

## 2018-04-22 MED ORDER — GUAIFENESIN-DM 100-10 MG/5ML PO SYRP: 5.0000 mL | ORAL_SOLUTION | ORAL | Status: DC | PRN

## 2018-04-22 NOTE — Progress Notes (Signed)
Gillespie, Jeffrey [id number] 16-Jun-1970 Jeffrey Nearing, MD   SUBJECTIVE: This 47 y.o. year old male is status post I&D of deep neck abscess. Doing well, though some sore throat and cough. No fever overnight.  Medications:  Current Facility-Administered Medications  Medication Dose Route Frequency Provider Last Rate Last Dose  . dextrose 5 % and 0.45 % NaCl with KCl 20 mEq/L infusion   Intravenous Continuous Clyde Canterbury, MD 100 mL/hr at 04/21/18 2244    . docusate sodium (COLACE) capsule 100 mg  100 mg Oral BID Clyde Canterbury, MD   100 mg at 04/21/18 2012  . heparin injection 5,000 Units  5,000 Units Subcutaneous Andee Poles, MD   5,000 Units at 04/21/18 2012  . HYDROcodone-acetaminophen (NORCO/VICODIN) 5-325 MG per tablet 1-2 tablet  1-2 tablet Oral Q4H PRN Clyde Canterbury, MD      .  Medications Prior to Admission  Medication Sig Dispense Refill  . amoxicillin-clavulanate (AUGMENTIN) 875-125 MG tablet Take 1 tablet by mouth 2 (two) times daily for 7 days. 14 tablet 0  . predniSONE (STERAPRED UNI-PAK 21 TAB) 10 MG (21) TBPK tablet Taper by 10 mg daily (Patient taking differently: Take 10-60 mg by mouth See admin instructions. 60 mg daily on day 1, then 50 mg daily on day 2, then 40 mg daily on day 3, then 30 mg daily on day 4, then 20 mg daily on day 5, then 10 mg daily on day 6, then stop) 21 tablet 0    OBJECTIVE:  PHYSICAL EXAM  Vitals: Blood pressure 106/67, pulse 72, temperature 98.6 F (37 C), temperature source Oral, resp. rate 18, height 5' 11" (1.803 m), weight 85.7 kg, SpO2 98 %.. General: Well-developed, Well-nourished in no acute distress Mood: Mood and affect well adjusted, pleasant and cooperative. Orientation: Grossly alert and oriented. Vocal Quality: No hoarseness. Communicates verbally. head and Face: NCAT. No facial asymmetry. No visible skin lesions. No significant facial scars. No tenderness with sinus percussion. Facial strength normal and symmetric. Nose:  External nose normal with midline dorsum and no lesions or deformity. Nasal Cavity reveals essentially midline septum with normal inferior turbinates. No significant mucosal congestion or erythema. Nasal secretions are minimal and clear. No polyps seen on anterior rhinoscopy. Oral Cavity/ Oropharynx: Lips are normal with no lesions. Teeth no frank dental caries. Gingiva healthy with no lesions or gingivitis. Oropharynx including tongue, buccal mucosa, floor of mouth, hard and soft palate, uvula and posterior pharynx free of exudates, erythema or lesions with normal symmetry and hydration.  Neck: Supple and symmetric with no palpable masses, tenderness or crepitance. The trachea is midline. Thyroid gland is soft, nontender and symmetric with no masses or enlargement. Parotid and submandibular glands are soft, nontender and symmetric, without masses. Lymphatic: Cervical lymph nodes are without palpable lymphadenopathy or tenderness. Respiratory: Normal respiratory effort without labored breathing. Cardiovascular: Carotid pulse shows regular rate and rhythm Neurologic: Cranial Nerves II through XII are grossly intact. Eyes: Gaze and Ocular Motility are grossly normal. PERRLA. No visible nystagmus.  MEDICAL DECISION MAKING: Data Review:  Results for orders placed or performed during the hospital encounter of 04/20/18 (from the past 48 hour(s))  CBC     Status: Abnormal   Collection Time: 04/20/18 10:24 AM  Result Value Ref Range   WBC 17.5 (H) 4.0 - 10.5 K/uL   RBC 5.68 4.22 - 5.81 MIL/uL   Hemoglobin 16.3 13.0 - 17.0 g/dL   HCT 48.4 39.0 - 52.0 %   MCV 85.2 80.0 -  100.0 fL   MCH 28.7 26.0 - 34.0 pg   MCHC 33.7 30.0 - 36.0 g/dL   RDW 13.2 11.5 - 15.5 %   Platelets 468 (H) 150 - 400 K/uL   nRBC 0.0 0.0 - 0.2 %    Comment: Performed at [facility], [address] [place], [place] [address]  Comprehensive metabolic panel     Status: Abnormal   Collection Time: 04/20/18 10:24 AM   Result Value Ref Range   Sodium 138 135 - 145 mmol/L   Potassium 3.5 3.5 - 5.1 mmol/L   Chloride 101 98 - 111 mmol/L   CO2 28 22 - 32 mmol/L   Glucose, Bld 101 (H) 70 - 99 mg/dL   BUN 20 6 - 20 mg/dL   Creatinine, Ser 0.97 0.61 - 1.24 mg/dL   Calcium 8.9 8.9 - 10.3 mg/dL   Total Protein 7.9 6.5 - 8.1 g/dL   Albumin 3.5 3.5 - 5.0 g/dL   AST 22 15 - 41 U/L   ALT 52 (H) 0 - 44 U/L   Alkaline Phosphatase 63 38 - 126 U/L   Total Bilirubin 0.9 0.3 - 1.2 mg/dL   GFR calc non Af Amer >60 >60 mL/min   GFR calc Af Amer >60 >60 mL/min    Comment: (NOTE) The eGFR has been calculated using the CKD EPI equation. This calculation has not been validated in all clinical situations. eGFR's persistently <60 mL/min signify possible Chronic Kidney Disease.    Anion gap 9 5 - 15    Comment: Performed at [facility], [address] [place], [place] [address]  Aerobic/Anaerobic Culture (surgical/deep wound)     Status: None (Preliminary result)   Collection Time: 04/20/18  7:04 PM  Result Value Ref Range   Specimen Description      NECK Performed at [facility], [address] [place], [place] [address]    Special Requests      NONE Performed at [facility], [address] [place], [place] [address]    Gram Stain      ABUNDANT WBC PRESENT, PREDOMINANTLY PMN RARE GRAM POSITIVE COCCI    Culture      NO GROWTH < 24 HOURS Performed at [facility], [address] [place], [place] [address]    Report Status PENDING   . Ct Soft Tissue Neck W Contrast  Result Date: 04/20/2018 CLINICAL DATA:  Recurrent neck swelling. EXAM: CT NECK WITH CONTRAST TECHNIQUE: Multidetector CT imaging of the neck was performed using the standard protocol following the bolus administration of intravenous contrast. CONTRAST:  75mL OMNIPAQUE IOHEXOL 300 MG/ML  SOLN COMPARISON:  CT neck April 17, 2018 FINDINGS: PHARYNX AND LARYNX: RIGHT parapharyngeal edema surrounding the hyoid  with effusion, no discrete enhancement. Fluid collection extends to the mylohyoid without extension into floor mouth. Patent airway. Normal epiglottis and larynx. SALIVARY GLANDS: Rim enhancing abscess within RIGHT tail of the parotid, RIGHT submandibular gland. Mild RIGHT submandibular gland ductal dilatation without sialolith. Collection extends to the retromolar trigone bilaterally. THYROID: Normal. LYMPH NODES: No lymphadenopathy by CT size criteria. VASCULAR: Edema and effusion into RIGHT carotid space. LIMITED INTRACRANIAL: Normal. VISUALIZED ORBITS: Normal. 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 Small RIGHT maxillary sinus air-fluid level. LEFT maxillary mucosal retention cyst. Minimal LEFT mastoid effusion. SKELETON: Nonacute. Unerupted accessory RIGHT maxillary incisor. No CT findings of dental pathology. No CT findings of discitis osteomyelitis or acute process in   the spine. UPPER CHEST: Lung apices are clear. No superior mediastinal lymphadenopathy. OTHER: Multiloculated 4.5 x 8.6 by 7.8 cm (transverse by AP by cc) rim enhancing fluid collection anterior neck extending to the RIGHT. Collection within the RIGHT strap muscle and contiguous with versus involving the anterior RIGHT sternocleidomastoid muscle. Thickened RIGHT platysma with extensive RIGHT neck subcutaneous fat stranding. No subcutaneous gas. Tiny metallic foreign body LEFT anterior neck superficial to the platysma. Chin implant within mandible pre symphyseal soft tissues. IMPRESSION: 1. Large multiloculated anterior neck abscess and effusion with cellulitis and myositis. Collection surrounds the mandible and hyoid without CT findings of dental pathology or osteomyelitis though MRI is more sensitive. 2. Abscess extending into RIGHT submandibular gland, RIGHT parotid gland and RIGHT carotid space. 3. Patent airway. 4. Acute findings discussed with and reconfirmed by  Dr.ROBERT Corky Downs on 04/20/2018 at 4:01 pm. Electronically Signed   By: Elon Alas M.D.   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 EXAM: ULTRASOUND OF HEAD/NECK SOFT TISSUES TECHNIQUE: Ultrasound examination of the head and neck soft tissues was performed in the area of clinical concern. COMPARISON:  Soft tissue neck ultrasound-04/19/2018; neck CT-04/17/2018; 04/14/2018 FINDINGS: Sonographic evaluation of the neck demonstrates development of a large (at least 9.2 x 6.5 x 2.1 cm) fluid collection/abscess involving the right side of the neck which was not seen on soft tissue neck ultrasound performed day prior. IMPRESSION: Interval development of approximately 9.2 cm presumed abscess within the right-side of the neck. Critical Value/emergent results were called by telephone at the time of interpretation on 04/20/2018 at 3:07 pm to Drs. Corky Downs (ED) and Richardson Landry (ENT), who verbally acknowledged these results. Electronically Signed   By: Sandi Mariscal M.D.   On: 04/20/2018 15:08  .   ASSESSMENT: S/p I&D of deep neck abscess, doing well, afebrile. Culture showed WBC's with rare GPC's. Suspect abscess was from group A strep from his original throat infection.    PLAN: Upper drains removed as they had practically worked their way out. Lower drain advanced out a little and re-secured. Up OOB, d/c fluids. Continue IV antibiotics.    Jeffrey Nearing, MD 04/22/2018 7:31 AMPatient ID: [place]ley Co, male   DOB: [birth year], 47 y.o.   MRN: [id number]

## 2018-04-23 MED ORDER — SODIUM CHLORIDE 0.9 % IV SOLN
3.0000 g | Freq: Once | INTRAVENOUS | Status: AC
  Administered 2018-04-23: 3 g via INTRAVENOUS
  Filled 2018-04-23: qty 3

## 2018-04-23 MED ORDER — DOCUSATE SODIUM 100 MG PO CAPS: 100.0000 mg | ORAL_CAPSULE | Freq: Two times a day (BID) | ORAL | 0 refills | Status: AC

## 2018-04-23 NOTE — Discharge Summary (Signed)
Patient came in with recurrent neck abscess, possibly from strep pharyngitis. I&D performed Friday in OR. Has done well over weekend with no fever and eating normal diet. Last drain removed this AM.  Will d/c home on prior Augmentin, but will have him not take the prednisone. Tylenol or Ibuprofen for pain. Change dressing prn as long as wounds are draining and clean wounds with peroxide BID-TID, and apply polysporin. No work for a week. F/u with me Wednesday

## 2018-04-23 NOTE — Progress Notes (Signed)
Pt is being discharged to home. Pt is AOx4, VSS, he denies all pain at this time. Dressing was changed. AVS was given and explained to pt and mother utilizing the Spanish interpretor at the bedside. They each verbalized understanding of the AVS and discharge plan of care.

## 2018-04-23 NOTE — Final Progress Note (Signed)
Jeffrey Gillespie, Jeffrey Gillespie [id number] August 31, 1970 [name], MD   SUBJECTIVE: This 47 y.o. year old male is status post I&D neck abscess. Doing well overnight. Some twinges of discomfort left lower quadrant, no significant pain in neck. Tolerating diet.  Medications:  Current Facility-Administered Medications  Medication Dose Route Frequency Provider Last Rate Last Dose  . docusate sodium (COLACE) capsule 100 mg  100 mg Oral BID Geanie Logan, MD   100 mg at 04/22/18 2115  . guaiFENesin-dextromethorphan (ROBITUSSIN DM) 100-10 MG/5ML syrup 5 mL  5 mL Oral Q4H PRN Geanie Logan, MD      . heparin injection 5,000 Units  5,000 Units Subcutaneous Tedra Coupe Geanie Logan, MD   5,000 Units at 04/23/18 [phone]  . HYDROcodone-acetaminophen (NORCO/VICODIN) 5-325 MG per tablet 1-2 tablet  1-2 tablet Oral Q4H PRN Geanie Logan, MD      .  Medications Prior to Admission  Medication Sig Dispense Refill  . amoxicillin-clavulanate (AUGMENTIN) 875-125 MG tablet Take 1 tablet by mouth 2 (two) times daily for 7 days. 14 tablet 0  . predniSONE (STERAPRED UNI-PAK 21 TAB) 10 MG (21) TBPK tablet Taper by 10 mg daily (Patient taking differently: Take 10-60 mg by mouth See admin instructions. 60 mg daily on day 1, then 50 mg daily on day 2, then 40 mg daily on day 3, then 30 mg daily on day 4, then 20 mg daily on day 5, then 10 mg daily on day 6, then stop) 21 tablet 0    OBJECTIVE:  PHYSICAL EXAM  Vitals: Blood pressure 109/70, pulse 69, temperature 97.9 F (36.6 C), temperature source Oral, resp. rate 20, height 5\' 11"  (1.803 m), weight 85.7 kg, SpO2 97 %.. General: Well-developed, Well-nourished in no acute distress Mood: Mood and affect well adjusted, pleasant and cooperative. Orientation: Grossly alert and oriented. Vocal Quality: No hoarseness. Communicates verbally. head and Face: NCAT. No facial asymmetry. No visible skin lesions. No significant facial scars. No tenderness with sinus percussion. Facial strength normal  and symmetric. Neck: Supple and symmetric with no palpable masses, tenderness or crepitance. Neck wound inspected. Upper wound with minimal d/c and beginning to close. No fluctuance or crepitance in upper neck. Drain removed from lower wound which has serous drainage, no pus. Lymphatic: Cervical lymph nodes are without palpable lymphadenopathy or tenderness. Respiratory: Normal respiratory effort without labored breathing.   MEDICAL DECISION MAKING: Data Review: No results found for this or any previous visit (from the past 48 hour(s)).Marland Kitchen No results found..   ASSESSMENT: Doing well. Last drain removed. Suspect mild left lower quad discomfort may be mild constipation  PLAN: D/c home on prior Augmentin, but will have him not take the prednisone. Tylenol or Ibuprofen for pain. Change dressing prn as long as wounds are draining and clean wounds with peroxide BID-TID, and apply polysporin. No work for a week. F/u with me Wednesday   [name], MD 04/23/2018 8:10 AM

## 2018-04-25 LAB — AEROBIC/ANAEROBIC CULTURE W GRAM STAIN (SURGICAL/DEEP WOUND): Culture: NORMAL

## 2018-04-25 LAB — AEROBIC/ANAEROBIC CULTURE (SURGICAL/DEEP WOUND)

## 2018-04-26 LAB — MISC LABCORP TEST (SEND OUT): LABCORP TEST CODE: 186150

## 2018-04-30 ENCOUNTER — Telehealth: Payer: Self-pay | Admitting: Licensed Clinical Social Worker

## 2018-04-30 NOTE — Telephone Encounter (Signed)
EMMI flagged patient for answering yes to feeling sad/hopeless/anxious/empty. Clinical Child psychotherapistocial Worker (CSW) contacted patient via telephone however he stated he needed a spanish interpreter. CSW contacted the language line and attempted to contact patient twice however he did not answer and a voicemail was left.   Baker Hughes IncorporatedBailey Aileen Amore, LCSW [phone]

## 2018-04-30 NOTE — Telephone Encounter (Signed)
Patient returned Clinical Social Worker (CSW)'s call and a spanish interpreter through the language line was utilized. Patient scored 0 on PHQ-9. Patient reported that he has no symptoms of depression and is not having thoughts of hurting himself. Patient asked about mental health services in the community for uninsured patients and CSW provided him with information on [facility]. Patient reported no needs or concerns.   [name], LCSW [phone]

## 2019-03-27 ENCOUNTER — Other Ambulatory Visit: Payer: Self-pay

## 2019-03-27 DIAGNOSIS — Z20822 Contact with and (suspected) exposure to covid-19: Secondary | ICD-10-CM

## 2019-03-28 LAB — NOVEL CORONAVIRUS, NAA: SARS-CoV-2, NAA: NOT DETECTED

## 2019-04-23 ENCOUNTER — Other Ambulatory Visit: Payer: Self-pay

## 2019-04-23 DIAGNOSIS — Z20822 Contact with and (suspected) exposure to covid-19: Secondary | ICD-10-CM

## 2019-04-25 LAB — NOVEL CORONAVIRUS, NAA: SARS-CoV-2, NAA: DETECTED — AB

## 2020-08-15 IMAGING — CT CT NECK W/ CM
4 of 5 series · 14 of 33 positions shown, 16 images · IV contrast (omnipaque)
Comparison: None.

CLINICAL DATA: LEFT dental pain for 2 days, now with throat pain
and swelling.

EXAM:
CT NECK WITH CONTRAST
TECHNIQUE: Multidetector CT imaging of the neck was performed using the
standard protocol following the bolus administration of intravenous
contrast.
CONTRAST:  75mL OMNIPAQUE IOHEXOL 300 MG/ML  SOLN

[Series 6: sag neck · sagittal · 0.46mm/px · 5 of 134 slices shown, 6 images]
[im 45/134  bone]
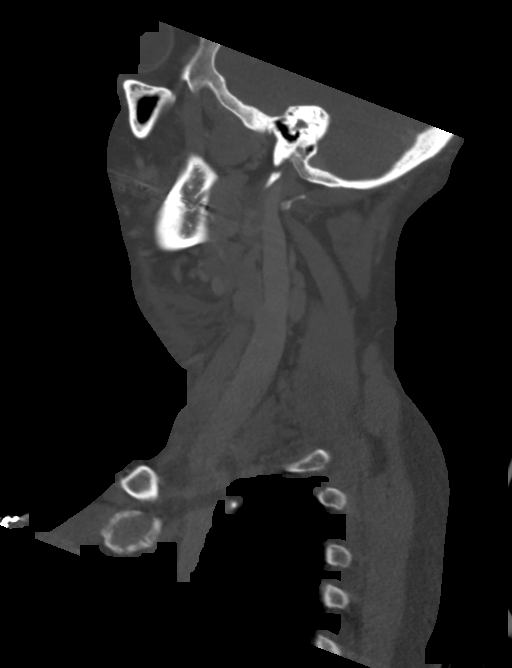
[im 56/134  bone]
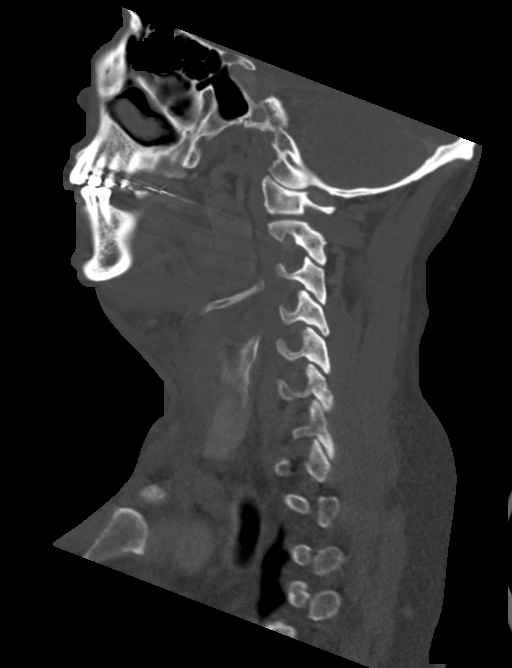
[im 67/134  soft-tissue]
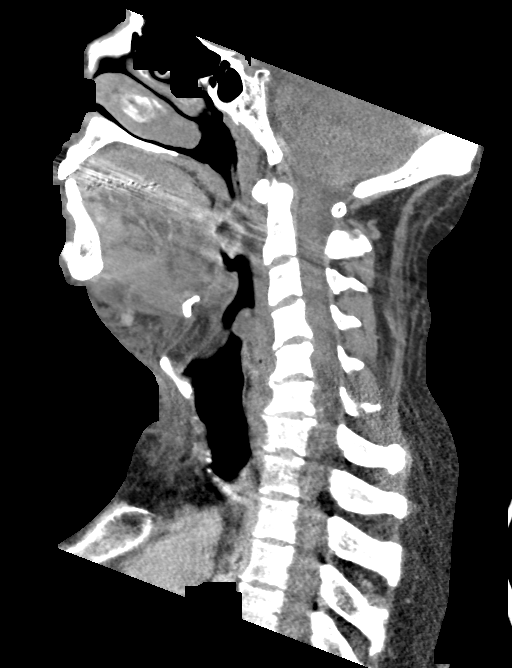
[im 67/134  bone]
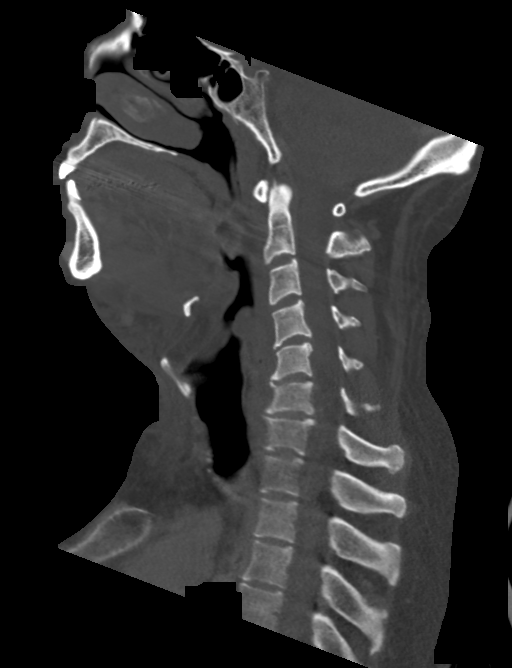
[im 78/134  bone]
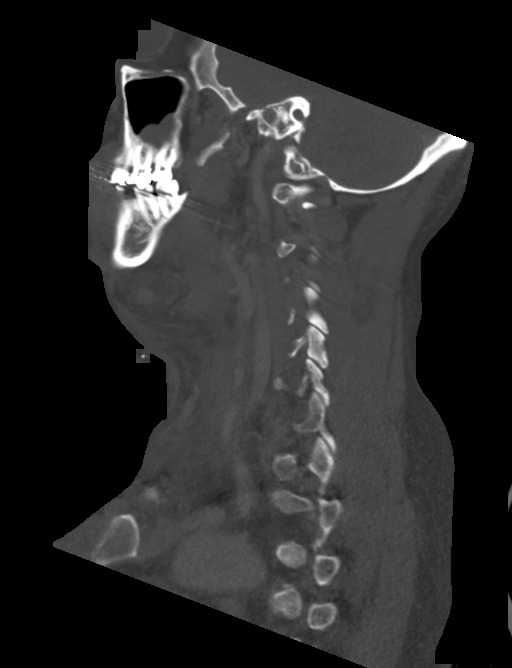
[im 89/134  bone]
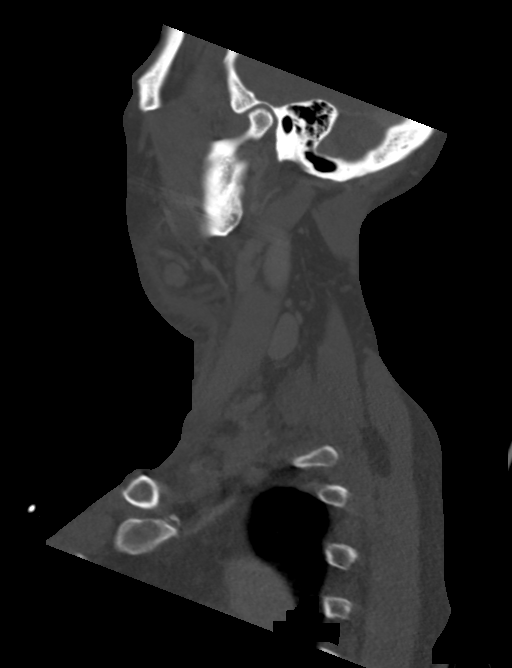

[Series 7: cor neck · coronal · 0.52mm/px · 3 of 74 slices shown]
[im 16/74  bone]
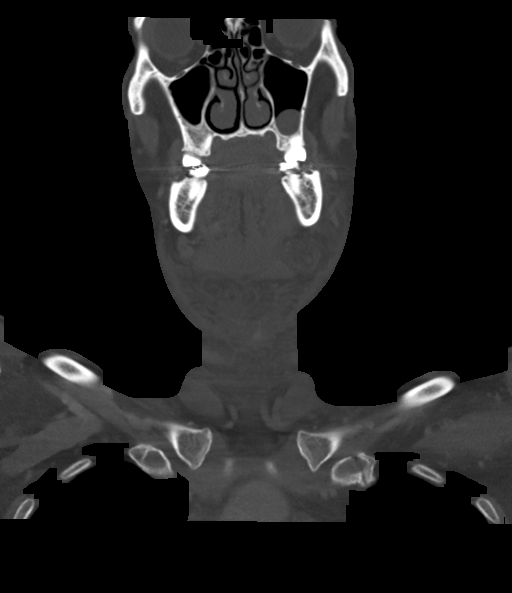
[im 30/74  bone]
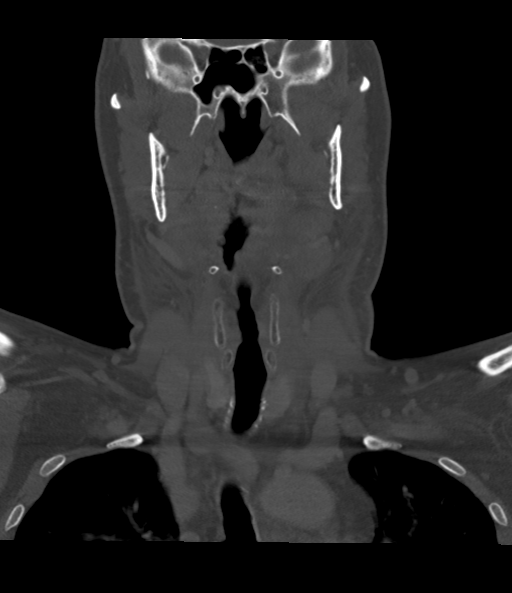
[im 44/74  bone]
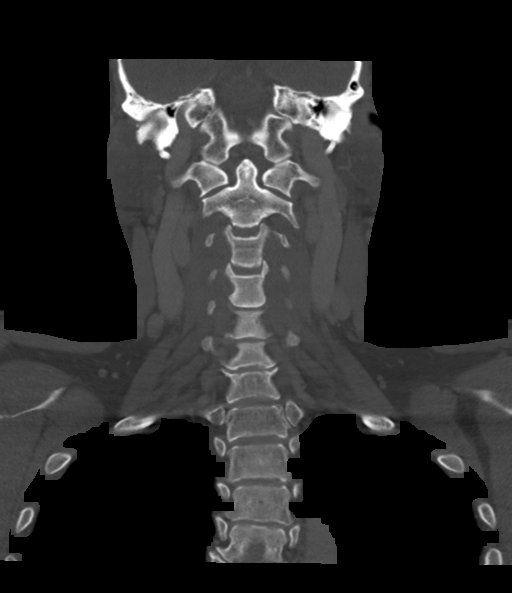

[Series 8: orthogonal ax · axial · 0.46mm/px · z∈[-224,-78]mm · 4 of 130 slices shown, 5 images]
[im 26/130  soft-tissue]
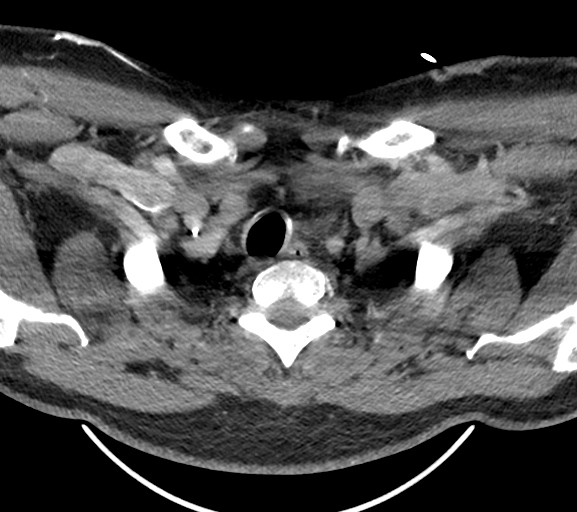
[im 26/130  bone]
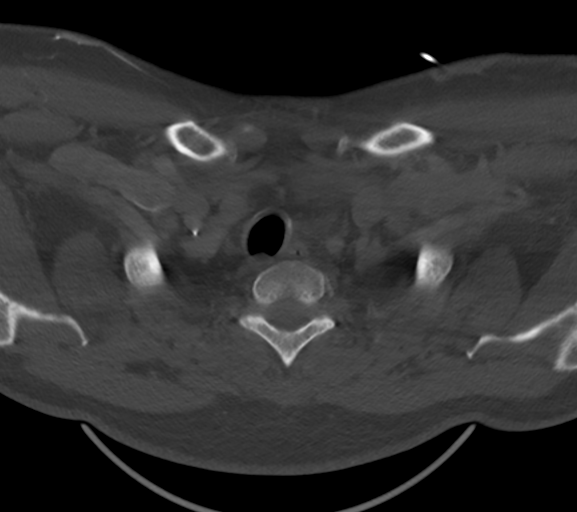
[im 52/130  bone]
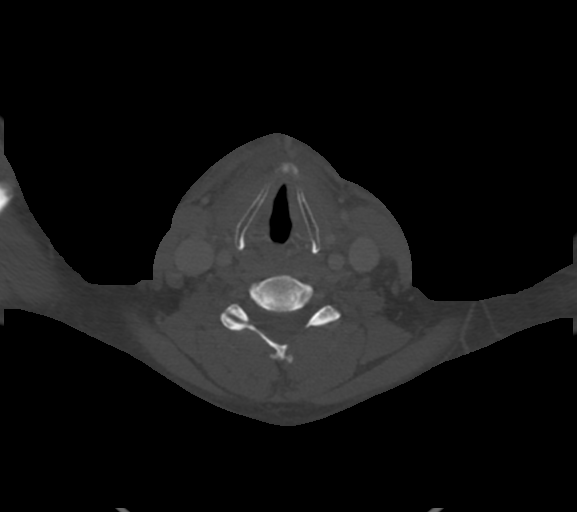
[im 78/130  bone]
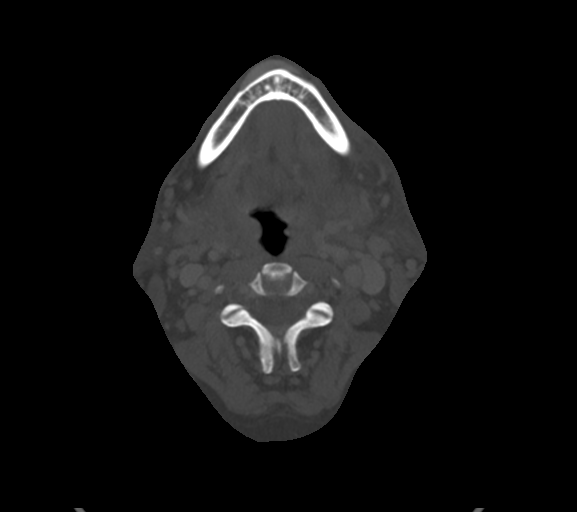
[im 104/130  bone]
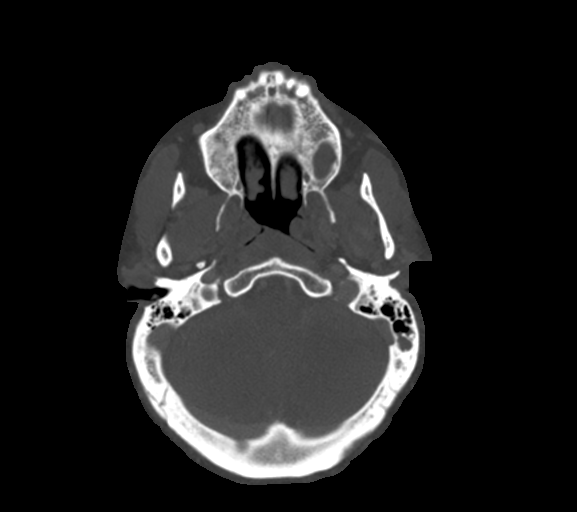

[Series 9: axial neck (person_name) · axial · 0.54mm/px · z∈[-192,-144]mm · 2 of 122 slices shown]
[im 25/122  bone]
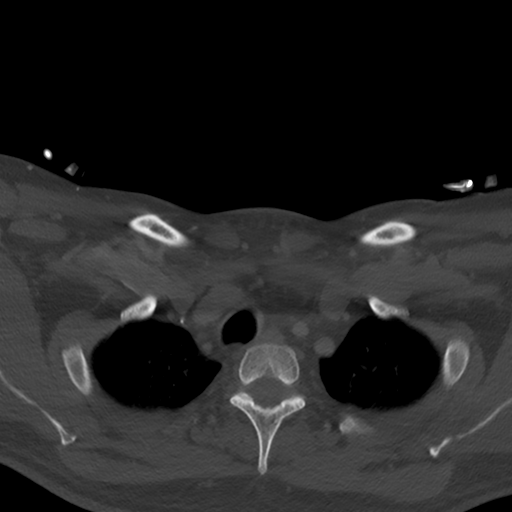
[im 49/122  bone]
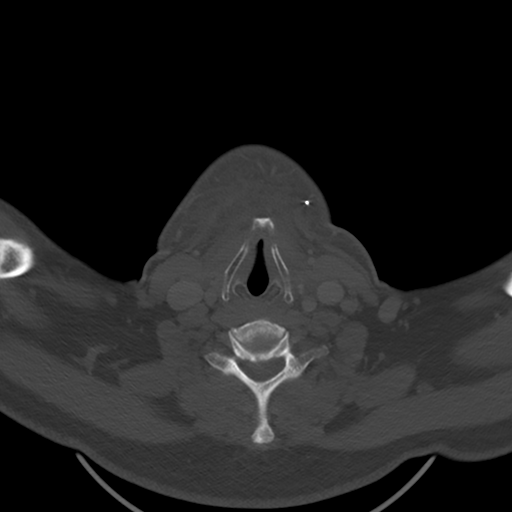

[14 of 33 positions shown; findings below may reference images not displayed]

FINDINGS: PHARYNX AND LARYNX: Mild LEFT hypopharyngeal edema narrowing the
LEFT piriform sinus. No extension into floor mouth.

SALIVARY GLANDS: Mildly enlarged edematous LEFT greater than RIGHT
submandibular glands without sialolith, ductal dilatation or mass.
Normal parotid glands.

THYROID: Normal.

LYMPH NODES: Mild reactive lymphadenopathy with reniform morphology,
homogeneous enhancement.

VASCULAR: Normal.

LIMITED INTRACRANIAL: Normal.

VISUALIZED ORBITS: Normal.

MASTOIDS AND VISUALIZED PARANASAL SINUSES: Mildly atretic RIGHT
maxillary sinus with findings of chronic sinusitis. LEFT maxillary
mucosal retention cyst. Minimal LEFT mastoid effusion.

SKELETON: Nonacute. No CT findings of dental pathology. Moderate
C6-7 spondylosis. Moderate RIGHT temporomandibular osteoarthrosis.

UPPER CHEST: Lung apices are clear. No superior mediastinal
lymphadenopathy.

OTHER: Anterior neck subcutaneous fat stranding. Punctate LEFT
anterior neck calcification versus radiopaque foreign bodies.
IMPRESSION: 1. Acute LEFT greater than RIGHT submandibular sialoadenitis.
2. Transspatial edema sparing floor of mouth. No abscess. Patent
airway.
3. Mild reactive lymphadenopathy.
4. Punctate LEFT anterior neck radiopaque foreign body versus
calcification, potentially related.

## 2020-08-21 IMAGING — CT CT NECK W/ CM
4 of 5 series · 14 of 33 positions shown, 16 images · IV contrast (omnipaque)
Comparison: CT neck April 17, 2018

CLINICAL DATA: Recurrent neck swelling.

EXAM:
CT NECK WITH CONTRAST
TECHNIQUE: Multidetector CT imaging of the neck was performed using the
standard protocol following the bolus administration of intravenous
contrast.
CONTRAST:  75mL OMNIPAQUE IOHEXOL 300 MG/ML  SOLN

[Series 2: axial neck · axial · 0.63mm/px · z∈[-235,-99]mm · 4 of 114 slices shown, 5 images]
[im 23/114  soft-tissue]
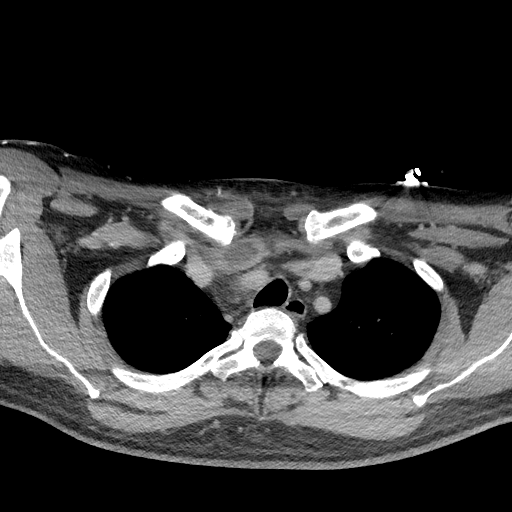
[im 23/114  bone]
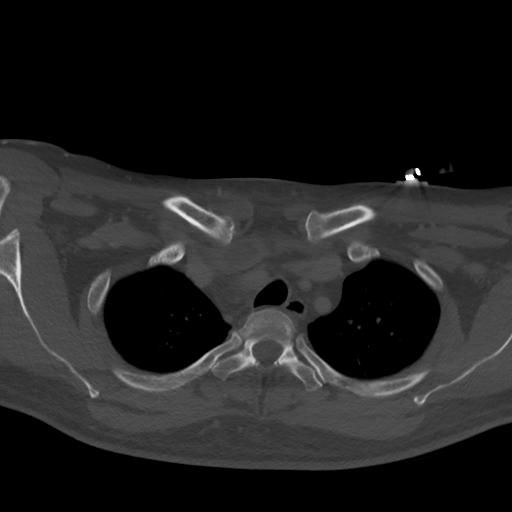
[im 46/114  bone]
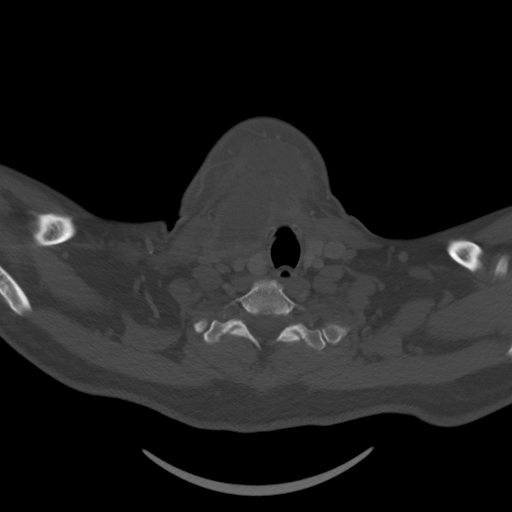
[im 68/114  bone]
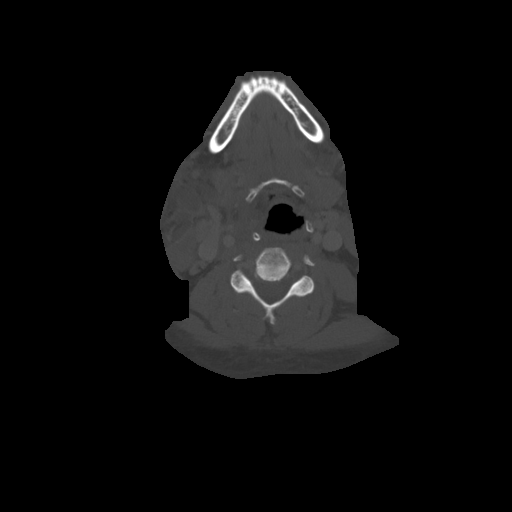
[im 91/114  bone]
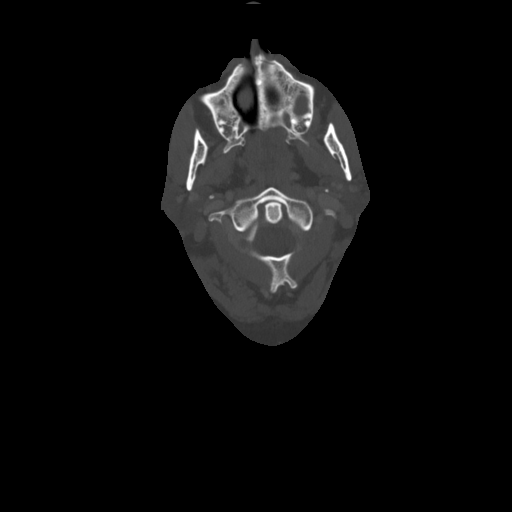

[Series 6: sag neck · sagittal · 0.49mm/px · 5 of 91 slices shown, 6 images]
[im 31/91  bone]
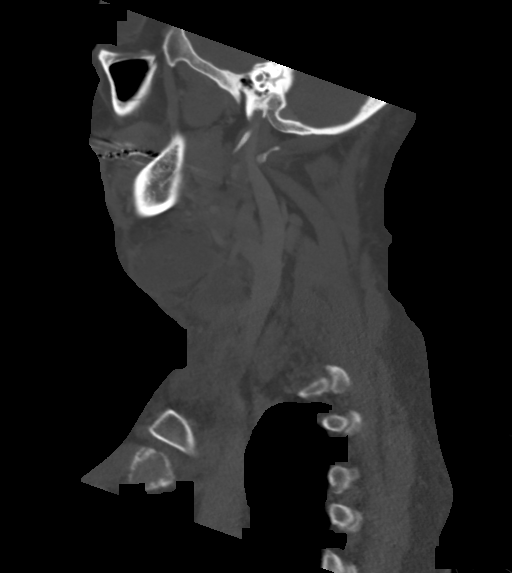
[im 38/91  bone]
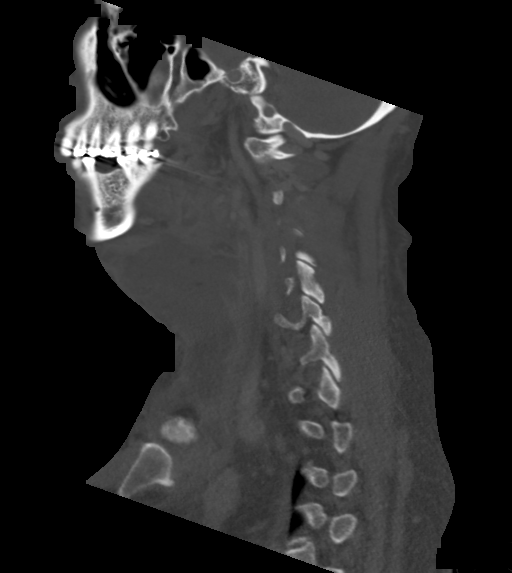
[im 46/91  soft-tissue]
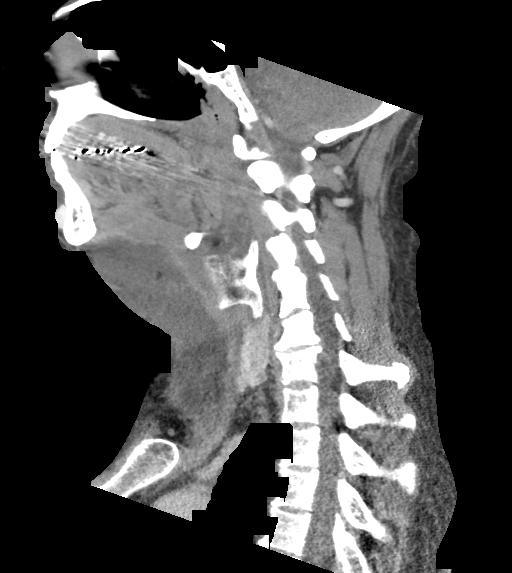
[im 46/91  bone]
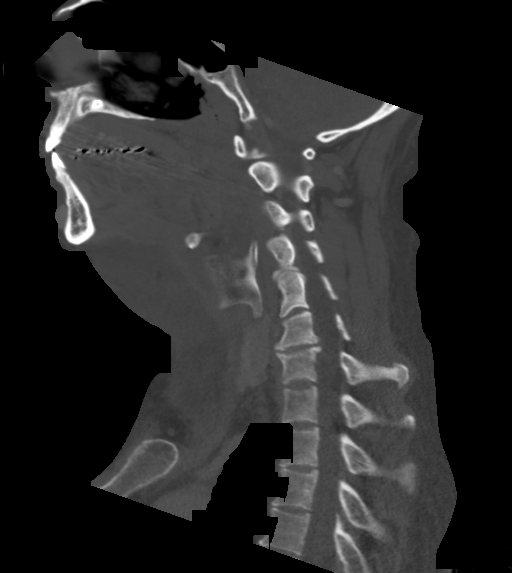
[im 53/91  bone]
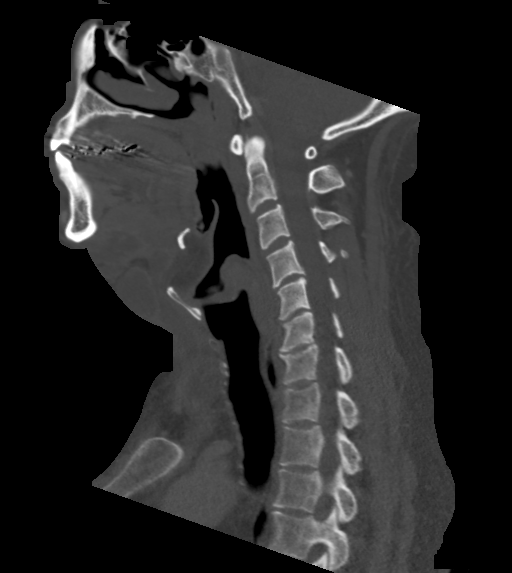
[im 61/91  bone]
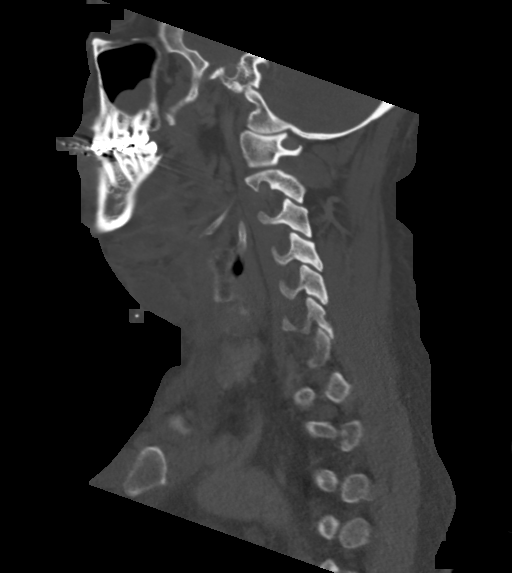

[Series 7: cor neck · coronal · 0.37mm/px · 3 of 103 slices shown]
[im 27/103  bone]
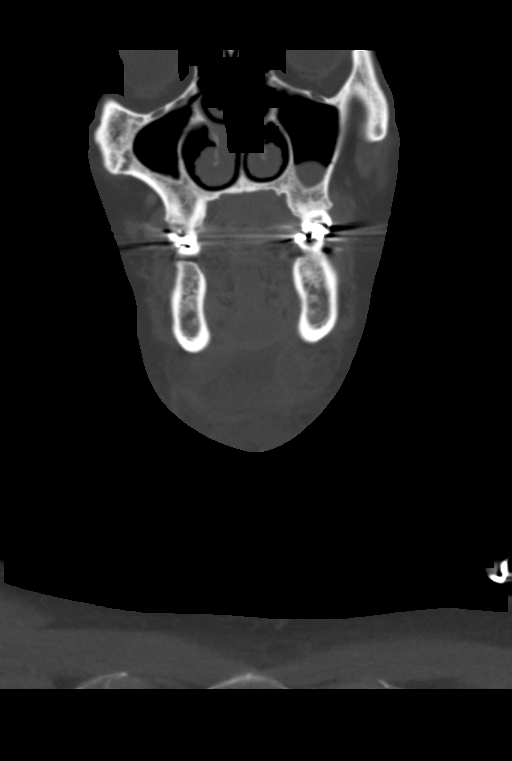
[im 43/103  bone]
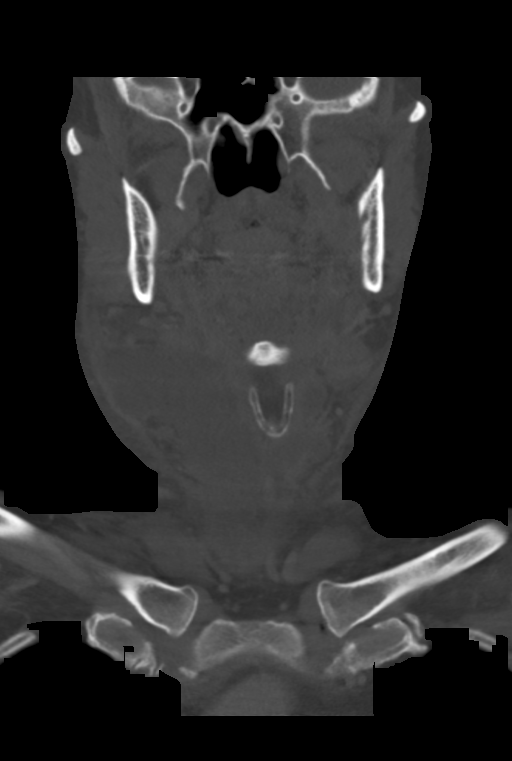
[im 60/103  bone]
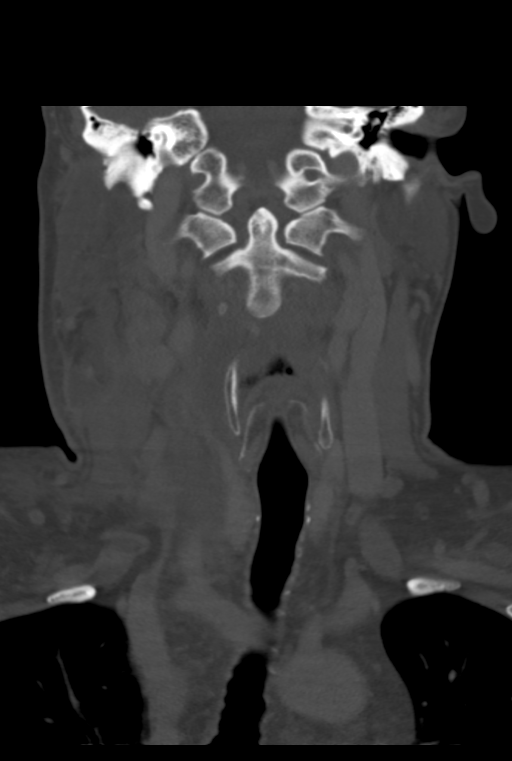

[Series 8: orthogonal ax · axial · 0.39mm/px · z∈[-286,-238]mm · 2 of 130 slices shown]
[im 26/130  bone]
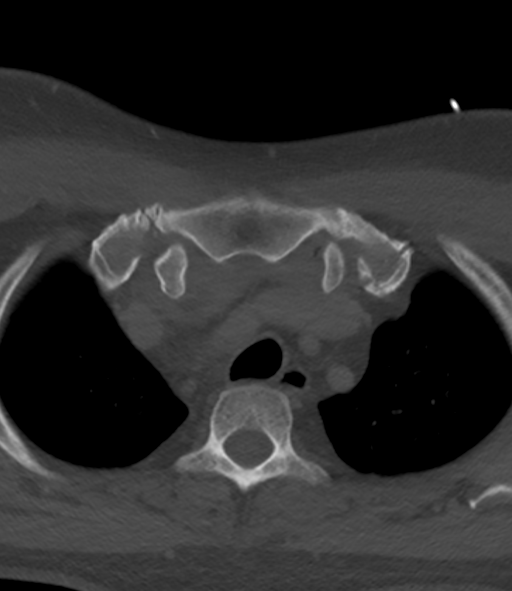
[im 52/130  bone]
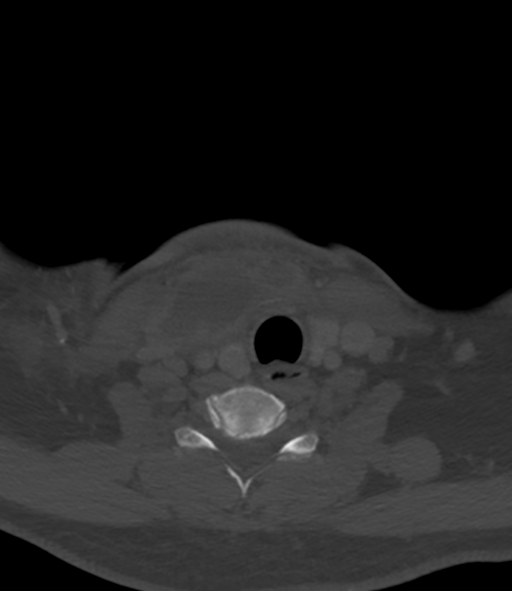

[14 of 33 positions shown; findings below may reference images not displayed]

FINDINGS: PHARYNX AND LARYNX: RIGHT parapharyngeal edema surrounding the hyoid
with effusion, no discrete enhancement. Fluid collection extends to
the mylohyoid without extension into floor mouth. Patent airway.
Normal epiglottis and larynx.

SALIVARY GLANDS: Rim enhancing abscess within RIGHT tail of the
parotid, RIGHT submandibular gland. Mild RIGHT submandibular gland
ductal dilatation without sialolith. Collection extends to the
retromolar trigone bilaterally.

THYROID: Normal.

LYMPH NODES: No lymphadenopathy by CT size criteria.

VASCULAR: Edema and effusion into RIGHT carotid space.

LIMITED INTRACRANIAL: Normal.

VISUALIZED ORBITS: Normal.

MASTOIDS AND VISUALIZED PARANASAL SINUSES: Chronic RIGHT maxillary
sinusitis with bony remodeling and atresia. Small RIGHT maxillary
sinus air-fluid level. LEFT maxillary mucosal retention cyst.
Minimal LEFT mastoid effusion.

SKELETON: Nonacute. Unerupted accessory RIGHT maxillary incisor. No
CT findings of dental pathology. No CT findings of discitis
osteomyelitis or acute process in the spine.

UPPER CHEST: Lung apices are clear. No superior mediastinal
lymphadenopathy.

OTHER: Multiloculated 4.5 x 8.6 by 7.8 cm (transverse by AP by cc)
rim enhancing fluid collection anterior neck extending to the RIGHT.
Collection within the RIGHT strap muscle and contiguous with versus
involving the anterior RIGHT sternocleidomastoid muscle. Thickened
RIGHT platysma with extensive RIGHT neck subcutaneous fat stranding.
No subcutaneous gas. Tiny metallic foreign body LEFT anterior neck
superficial to the platysma. Chin implant within mandible pre
symphyseal soft tissues.
IMPRESSION: 1. Large multiloculated anterior neck abscess and effusion with
cellulitis and myositis. Collection surrounds the mandible and hyoid
without CT findings of dental pathology or osteomyelitis though MRI
is more sensitive.
2. Abscess extending into RIGHT submandibular gland, RIGHT parotid
gland and RIGHT carotid space.
3. Patent airway.
4. Acute findings discussed with and reconfirmed by Dr.JOCHE HIJERRA
on 04/20/2018 at [DATE].
